# Patient Record
Sex: Female | Born: 1975 | Race: White | Hispanic: No | Smoking: Never smoker
Health system: Southern US, Community
[De-identification: ages and names within clinical notes are randomized; demographics above are authoritative.]

## PROBLEM LIST (undated history)

## (undated) DIAGNOSIS — M545 Low back pain, unspecified: Secondary | ICD-10-CM

## (undated) DIAGNOSIS — D6862 Lupus anticoagulant syndrome: Secondary | ICD-10-CM

## (undated) DIAGNOSIS — E038 Other specified hypothyroidism: Secondary | ICD-10-CM

## (undated) DIAGNOSIS — I10 Essential (primary) hypertension: Secondary | ICD-10-CM

## (undated) DIAGNOSIS — E559 Vitamin D deficiency, unspecified: Secondary | ICD-10-CM

## (undated) DIAGNOSIS — F411 Generalized anxiety disorder: Secondary | ICD-10-CM

## (undated) DIAGNOSIS — G43119 Migraine with aura, intractable, without status migrainosus: Secondary | ICD-10-CM

## (undated) DIAGNOSIS — D519 Vitamin B12 deficiency anemia, unspecified: Secondary | ICD-10-CM

## (undated) DIAGNOSIS — E782 Mixed hyperlipidemia: Secondary | ICD-10-CM

## (undated) HISTORY — DX: Low back pain, unspecified: M54.50

## (undated) HISTORY — DX: Mixed hyperlipidemia: E78.2

## (undated) HISTORY — DX: Essential (primary) hypertension: I10

## (undated) HISTORY — DX: Vitamin D deficiency, unspecified: E55.9

## (undated) HISTORY — PX: OVARIAN CYST REMOVAL: SHX89

## (undated) HISTORY — DX: Other specified hypothyroidism: E03.8

## (undated) HISTORY — PX: TUBAL LIGATION: SHX77

## (undated) HISTORY — DX: Migraine with aura, intractable, without status migrainosus: G43.119

## (undated) HISTORY — DX: Lupus anticoagulant syndrome: D68.62

## (undated) HISTORY — DX: Generalized anxiety disorder: F41.1

## (undated) HISTORY — DX: Vitamin B12 deficiency anemia, unspecified: D51.9

---
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
## 2014-06-19 DIAGNOSIS — M549 Dorsalgia, unspecified: Secondary | ICD-10-CM

## 2014-06-19 HISTORY — DX: Dorsalgia, unspecified: M54.9

## 2017-01-03 DIAGNOSIS — R202 Paresthesia of skin: Secondary | ICD-10-CM

## 2017-01-03 DIAGNOSIS — R0789 Other chest pain: Secondary | ICD-10-CM | POA: Insufficient documentation

## 2017-01-03 DIAGNOSIS — R200 Anesthesia of skin: Secondary | ICD-10-CM

## 2017-01-03 DIAGNOSIS — R0602 Shortness of breath: Secondary | ICD-10-CM

## 2017-01-03 HISTORY — DX: Paresthesia of skin: R20.0

## 2017-01-03 HISTORY — DX: Paresthesia of skin: R20.2

## 2017-01-03 HISTORY — DX: Other chest pain: R07.89

## 2017-01-03 HISTORY — DX: Shortness of breath: R06.02

## 2017-01-17 DIAGNOSIS — R899 Unspecified abnormal finding in specimens from other organs, systems and tissues: Secondary | ICD-10-CM

## 2017-01-17 HISTORY — DX: Unspecified abnormal finding in specimens from other organs, systems and tissues: R89.9

## 2017-05-23 DIAGNOSIS — IMO0002 Reserved for concepts with insufficient information to code with codable children: Secondary | ICD-10-CM

## 2017-05-23 HISTORY — DX: Reserved for concepts with insufficient information to code with codable children: IMO0002

## 2017-10-15 DIAGNOSIS — N261 Atrophy of kidney (terminal): Secondary | ICD-10-CM

## 2017-10-15 DIAGNOSIS — I10 Essential (primary) hypertension: Secondary | ICD-10-CM | POA: Insufficient documentation

## 2017-10-15 HISTORY — DX: Atrophy of kidney (terminal): N26.1

## 2019-06-20 ENCOUNTER — Ambulatory Visit (INDEPENDENT_AMBULATORY_CARE_PROVIDER_SITE_OTHER): Payer: Self-pay | Admitting: Cardiology

## 2019-06-20 ENCOUNTER — Other Ambulatory Visit: Payer: Self-pay

## 2019-06-20 ENCOUNTER — Encounter: Payer: Self-pay | Admitting: Cardiology

## 2019-06-20 VITALS — BP 148/100 | HR 90 | Ht 63.0 in | Wt 148.0 lb

## 2019-06-20 DIAGNOSIS — M545 Low back pain, unspecified: Secondary | ICD-10-CM | POA: Insufficient documentation

## 2019-06-20 DIAGNOSIS — R200 Anesthesia of skin: Secondary | ICD-10-CM

## 2019-06-20 DIAGNOSIS — R079 Chest pain, unspecified: Secondary | ICD-10-CM

## 2019-06-20 DIAGNOSIS — G43119 Migraine with aura, intractable, without status migrainosus: Secondary | ICD-10-CM | POA: Insufficient documentation

## 2019-06-20 DIAGNOSIS — F411 Generalized anxiety disorder: Secondary | ICD-10-CM | POA: Insufficient documentation

## 2019-06-20 DIAGNOSIS — E782 Mixed hyperlipidemia: Secondary | ICD-10-CM

## 2019-06-20 DIAGNOSIS — E559 Vitamin D deficiency, unspecified: Secondary | ICD-10-CM | POA: Insufficient documentation

## 2019-06-20 DIAGNOSIS — E038 Other specified hypothyroidism: Secondary | ICD-10-CM | POA: Insufficient documentation

## 2019-06-20 DIAGNOSIS — D6862 Lupus anticoagulant syndrome: Secondary | ICD-10-CM | POA: Insufficient documentation

## 2019-06-20 DIAGNOSIS — I10 Essential (primary) hypertension: Secondary | ICD-10-CM

## 2019-06-20 DIAGNOSIS — D519 Vitamin B12 deficiency anemia, unspecified: Secondary | ICD-10-CM | POA: Insufficient documentation

## 2019-06-20 DIAGNOSIS — R0602 Shortness of breath: Secondary | ICD-10-CM

## 2019-06-20 HISTORY — DX: Chest pain, unspecified: R07.9

## 2019-06-20 MED ORDER — LOSARTAN POTASSIUM 25 MG PO TABS: 25.0000 mg | ORAL_TABLET | Freq: Every day | ORAL | 1 refills | Status: DC

## 2019-06-20 NOTE — Patient Instructions (Signed)
Medication Instructions:  Your physician has recommended you make the following change in your medication:   STOP: Lisinopril   START: Losartan 25 mg daily   *If you need a refill on your cardiac medications before your next appointment, please call your pharmacy*   Lab Work: None.   If you have labs (blood work) drawn today and your tests are completely normal, you will receive your results only by: Marland Kitchen MyChart Message (if you have MyChart) OR . A paper copy in the mail If you have any lab test that is abnormal or we need to change your treatment, we will call you to review the results.   Testing/Procedures: Your physician has requested that you have an echocardiogram. Echocardiography is a painless test that uses sound waves to create images of your heart. It provides your doctor with information about the size and shape of your heart and how well your heart's chambers and valves are working. This procedure takes approximately one hour. There are no restrictions for this procedure.  Your physician has requested that you have en exercise stress myoview. For further information please visit [email or web address]. Please follow instruction sheet, as given.     Follow-Up: At [facility], you and your health needs are our priority.  As part of our continuing mission to provide you with exceptional heart care, we have created designated Provider Care Teams.  These Care Teams include your primary Cardiologist (physician) and Advanced Practice Providers (APPs -  Physician Assistants and Nurse Practitioners) who all work together to provide you with the care you need, when you need it.  We recommend signing up for the patient portal called "MyChart".  Sign up information is provided on this After Visit Summary.  MyChart is used to connect with patients for Virtual Visits (Telemedicine).  Patients are able to view lab/test results, encounter notes, upcoming appointments, etc.  Non-urgent  messages can be sent to your provider as well.   To learn more about what you can do with MyChart, go to [email or web address].    Your next appointment:   1 month(s)  The format for your next appointment:   In Person  Provider:   Berniece Salines, DO   Other Instructions   Echocardiogram An echocardiogram is a procedure that uses painless sound waves (ultrasound) to produce an image of the heart. Images from an echocardiogram can provide important information about:  Signs of coronary artery disease (CAD).  Aneurysm detection. An aneurysm is a weak or damaged part of an artery wall that bulges out from the normal force of blood pumping through the body.  Heart size and shape. Changes in the size or shape of the heart can be associated with certain conditions, including heart failure, aneurysm, and CAD.  Heart muscle function.  Heart valve function.  Signs of a past heart attack.  Fluid buildup around the heart.  Thickening of the heart muscle.  A tumor or infectious growth around the heart valves. Tell a health care provider about:  Any allergies you have.  All medicines you are taking, including vitamins, herbs, eye drops, creams, and over-the-counter medicines.  Any blood disorders you have.  Any surgeries you have had.  Any medical conditions you have.  Whether you are pregnant or may be pregnant. What are the risks? Generally, this is a safe procedure. However, problems may occur, including:  Allergic reaction to dye (contrast) that may be used during the procedure. What happens before the procedure? No specific preparation is needed. You  may eat and drink normally. What happens during the procedure?   An IV tube may be inserted into one of your veins.  You may receive contrast through this tube. A contrast is an injection that improves the quality of the pictures from your heart.  A gel will be applied to your chest.  A wand-like tool (transducer)  will be moved over your chest. The gel will help to transmit the sound waves from the transducer.  The sound waves will harmlessly bounce off of your heart to allow the heart images to be captured in real-time motion. The images will be recorded on a computer. The procedure may vary among health care providers and hospitals. What happens after the procedure?  You may return to your normal, everyday life, including diet, activities, and medicines, unless your health care provider tells you not to do that. Summary  An echocardiogram is a procedure that uses painless sound waves (ultrasound) to produce an image of the heart.  Images from an echocardiogram can provide important information about the size and shape of your heart, heart muscle function, heart valve function, and fluid buildup around your heart.  You do not need to do anything to prepare before this procedure. You may eat and drink normally.  After the echocardiogram is completed, you may return to your normal, everyday life, unless your health care provider tells you not to do that.  Losartan Tablets What is this medicine? LOSARTAN (loe SAR tan) is an angiotensin II receptor blocker, also known as an ARB. It treats high blood pressure. It can slow kidney damage in some patients. It may also be used to lower the risk of stroke. This medicine may be used for other purposes; ask your health care provider or pharmacist if you have questions. COMMON BRAND NAME(S): Cozaar What should I tell my health care provider before I take this medicine? They need to know if you have any of these conditions:  heart failure  kidney or liver disease  an unusual or allergic reaction to losartan, other medicines, foods, dyes, or preservatives  pregnant or trying to get pregnant  breast-feeding How should I use this medicine? Take this drug by mouth. Take it as directed on the prescription label at the same time every day. You can take it with  or without food. If it upsets your stomach, take it with food. Keep taking it unless your health care provider tells you to stop. Talk to your health care provider about the use of this drug in children. While it may be prescribed for children as young as 6 for selected conditions, precautions do apply. Overdosage: If you think you have taken too much of this medicine contact a poison control center or emergency room at once. NOTE: This medicine is only for you. Do not share this medicine with others. What if I miss a dose? If you miss a dose, take it as soon as you can. If it is almost time for your next dose, take only that dose. Do not take double or extra doses. What may interact with this medicine?  blood pressure medicines  diuretics, especially triamterene, spironolactone, or amiloride  fluconazole  NSAIDs, medicines for pain and inflammation, like ibuprofen or naproxen  potassium salts or potassium supplements  rifampin This list may not describe all possible interactions. Give your health care provider a list of all the medicines, herbs, non-prescription drugs, or dietary supplements you use. Also tell them if you smoke, drink alcohol, or  use illegal drugs. Some items may interact with your medicine. What should I watch for while using this medicine? Visit your doctor or health care professional for regular checks on your progress. Check your blood pressure as directed. Ask your doctor or health care professional what your blood pressure should be and when you should contact him or her. Call your doctor or health care professional if you notice an irregular or fast heart beat. Women should inform their doctor if they wish to become pregnant or think they might be pregnant. There is a potential for serious side effects to an unborn child, particularly in the second or third trimester. Talk to your health care professional or pharmacist for more information. You may get drowsy or dizzy.  Do not drive, use machinery, or do anything that needs mental alertness until you know how this drug affects you. Do not stand or sit up quickly, especially if you are an older patient. This reduces the risk of dizzy or fainting spells. Alcohol can make you more drowsy and dizzy. Avoid alcoholic drinks. Avoid salt substitutes unless you are told otherwise by your doctor or health care professional. Do not treat yourself for coughs, colds, or pain while you are taking this medicine without asking your doctor or health care professional for advice. Some ingredients may increase your blood pressure. What side effects may I notice from receiving this medicine? Side effects that you should report to your doctor or health care professional as soon as possible:  confusion, dizziness, light headedness or fainting spells  decreased amount of urine passed  difficulty breathing or swallowing, hoarseness, or tightening of the throat  fast or irregular heart beat, palpitations, or chest pain  skin rash, itching  swelling of your face, lips, tongue, hands, or feet Side effects that usually do not require medical attention (report to your doctor or health care professional if they continue or are bothersome):  cough  decreased sexual function or desire  headache  nasal congestion or stuffiness  nausea or stomach pain  sore or cramping muscles This list may not describe all possible side effects. Call your doctor for medical advice about side effects. You may report side effects to FDA at 1-800-FDA-1088. Where should I keep my medicine? Keep out of the reach of children and pets. Store at room temperature between 15 and 30 degrees C (59 and 86 degrees F). Protect from light. Keep the container tightly closed. Throw away any unused drug after the expiration date. NOTE: This sheet is a summary. It may not cover all possible information. If you have questions about this medicine, talk to your doctor,  pharmacist, or health care provider.  2020 Elsevier/Gold Standard (2018-10-23 12:12:28)  This information is not intended to replace advice given to you by your health care provider. Make sure you discuss any questions you have with your health care provider. Document Revised: 07/11/2018 Document Reviewed: 04/22/2016 Elsevier Patient Education  2020 [employer]   Cardiac Nuclear Scan A cardiac nuclear scan is a test that measures blood flow to the heart when a person is resting and when he or she is exercising. The test looks for problems such as:  Not enough blood reaching a portion of the heart.  The heart muscle not working normally. You may need this test if:  You have heart disease.  You have had abnormal lab results.  You have had heart surgery or a balloon procedure to open up blocked arteries (angioplasty).  You have chest  pain.  You have shortness of breath. In this test, a radioactive dye (tracer) is injected into your bloodstream. After the tracer has traveled to your heart, an imaging device is used to measure how much of the tracer is absorbed by or distributed to various areas of your heart. This procedure is usually done at a hospital and takes 2-4 hours. Tell a health care provider about:  Any allergies you have.  All medicines you are taking, including vitamins, herbs, eye drops, creams, and over-the-counter medicines.  Any problems you or family members have had with anesthetic medicines.  Any blood disorders you have.  Any surgeries you have had.  Any medical conditions you have.  Whether you are pregnant or may be pregnant. What are the risks? Generally, this is a safe procedure. However, problems may occur, including:  Serious chest pain and heart attack. This is only a risk if the stress portion of the test is done.  Rapid heartbeat.  Sensation of warmth in your chest. This usually passes quickly.  Allergic reaction to the tracer. What  happens before the procedure?  Ask your health care provider about changing or stopping your regular medicines. This is especially important if you are taking diabetes medicines or blood thinners.  Follow instructions from your health care provider about eating or drinking restrictions.  Remove your jewelry on the day of the procedure. What happens during the procedure?  An IV will be inserted into one of your veins.  Your health care provider will inject a small amount of radioactive tracer through the IV.  You will wait for 20-40 minutes while the tracer travels through your bloodstream.  Your heart activity will be monitored with an electrocardiogram (ECG).  You will lie down on an exam table.  Images of your heart will be taken for about 15-20 minutes.  You may also have a stress test. For this test, one of the following may be done: ? You will exercise on a treadmill or stationary bike. While you exercise, your heart's activity will be monitored with an ECG, and your blood pressure will be checked. ? You will be given medicines that will increase blood flow to parts of your heart. This is done if you are unable to exercise.  When blood flow to your heart has peaked, a tracer will again be injected through the IV.  After 20-40 minutes, you will get back on the exam table and have more images taken of your heart.  Depending on the type of tracer used, scans may need to be repeated 3-4 hours later.  Your IV line will be removed when the procedure is over. The procedure may vary among health care providers and hospitals. What happens after the procedure?  Unless your health care provider tells you otherwise, you may return to your normal schedule, including diet, activities, and medicines.  Unless your health care provider tells you otherwise, you may increase your fluid intake. This will help to flush the contrast dye from your body. Drink enough fluid to keep your urine pale  yellow.  Ask your health care provider, or the department that is doing the test: ? When will my results be ready? ? How will I get my results? Summary  A cardiac nuclear scan measures the blood flow to the heart when a person is resting and when he or she is exercising.  Tell your health care provider if you are pregnant.  Before the procedure, ask your health care provider about  changing or stopping your regular medicines. This is especially important if you are taking diabetes medicines or blood thinners.  After the procedure, unless your health care provider tells you otherwise, increase your fluid intake. This will help flush the contrast dye from your body.  After the procedure, unless your health care provider tells you otherwise, you may return to your normal schedule, including diet, activities, and medicines. This information is not intended to replace advice given to you by your health care provider. Make sure you discuss any questions you have with your health care provider. Document Revised: 09/03/2017 Document Reviewed: 09/03/2017 Elsevier Patient Education  2020 [employer].

## 2019-06-20 NOTE — Progress Notes (Signed)
Cardiology Office Note:    Date:  06/20/2019   ID:  Donna Sutton, DOB 08-23-1975, MRN [id number]  PCP:  Bonnita Nasuti, MD  Cardiologist:  No primary care provider on file.  Electrophysiologist:  None   Referring MD: Bonnita Nasuti, MD   Chief Complaint  Patient presents with  . Chest Pain  . Shortness of Breath    History of Present Illness:    Donna Sutton is a 44 y.o. female with a hx of hypertension, hyperlipidemia, family history of premature coronary artery disease who presents today to be evaluated for chest.  Patient tells me for 3 weeks she has been experiencing left-sided sharp shooting pain.  He denies any radiation but tells me it is associated with shortness of breath.  At times she tells me that she feels dizzy.  Recently, the patient visited [facility] due to facial numbness and tingling she had a CT scan which was reported to be normal.  She was asked to stay to get an MRI and has been concerned about stroke but the patient left [place].   She tells me that she has not been taking lisinopril as this medications makes her significant tired no other complaints at this time..  Past Medical History:  Diagnosis Date  . B12 deficiency anemia   . Essential hypertension   . Generalized anxiety disorder   . Low back pain   . 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 Migraine with aura, intractable, without status migrainosus   . Mixed hyperlipidemia   . Other specified hypothyroidism   . Vitamin D deficiency     Past Surgical History:  Procedure Laterality Date  . CESAREAN SECTION     X4  . OVARIAN CYST REMOVAL    . TUBAL LIGATION      Current Medications: Current Meds  Medication Sig  . Cholecalciferol 125 MCG (5000 UT) TABS Take by mouth.  . cyanocobalamin 1000 MCG tablet Take by mouth.  . ergocalciferol (VITAMIN D2) 1.25 MG (50000 UT) capsule Take by mouth.  . hydroxychloroquine (PLAQUENIL) 200 MG tablet Take 200 mg by mouth daily.    . Omega-3 Fatty Acids (FISH OIL) 1000 MG CAPS Take 1,000 mg by mouth daily.  Marland Kitchen topiramate (TOPAMAX) 50 MG tablet Take 50 mg by mouth daily.  . [DISCONTINUED] lisinopril (ZESTRIL) 5 MG tablet Take 5 mg by mouth daily. Take only if BP is greater than 140/90     Allergies:   Morphine, Other, and Piperacillin-tazobactam in dex   Social History   Socioeconomic History  . Marital status: Unknown    Spouse name: Not on file  . Number of children: Not on file  . Years of education: Not on file  . Highest education level: Not on file  Occupational History  . Not on file  Tobacco Use  . Smoking status: Never Smoker  . Smokeless tobacco: Never Used  Substance and Sexual Activity  . Alcohol use: Never  . Drug use: Never  . Sexual activity: Not on file  Other Topics Concern  . Not on file  Social History Narrative  . Not on file   Social Determinants of Health   Financial Resource Strain:   . Difficulty of Paying Living Expenses:   Food Insecurity:   . Worried About Charity fundraiser in the Last Year:   . Arboriculturist in the Last Year:   Transportation Needs:   . Film/video editor (Medical):   Marland Kitchen  Lack of Transportation (Non-Medical):   Physical Activity:   . Days of Exercise per Week:   . Minutes of Exercise per Session:   Stress:   . Feeling of Stress :   Social Connections:   . Frequency of Communication with Friends and Family:   . Frequency of Social Gatherings with Friends and Family:   . Attends Religious Services:   . Active Member of Clubs or Organizations:   . Attends Archivist Meetings:   Marland Kitchen Marital Status:      Family History: The patient's family history includes Anxiety disorder in her mother; COPD in her mother; Kidney failure in her father.  ROS:   Review of Systems  Constitution: Negative for decreased appetite, fever and weight gain.  HENT: Negative for congestion, ear discharge, hoarse voice and sore throat.   Eyes: Negative for  discharge, redness, vision loss in right eye and visual halos.  Cardiovascular: Reports chest pain, dyspnea on exertion.  Negative for, leg swelling, orthopnea and palpitations.  Respiratory: Negative for cough, hemoptysis, shortness of breath and snoring.   Endocrine: Negative for heat intolerance and polyphagia.  Hematologic/Lymphatic: Negative for bleeding problem. Does not bruise/bleed easily.  Skin: Negative for flushing, nail changes, rash and suspicious lesions.  Musculoskeletal: Negative for arthritis, joint pain, muscle cramps, myalgias, neck pain and stiffness.  Gastrointestinal: Negative for abdominal pain, bowel incontinence, diarrhea and excessive appetite.  Genitourinary: Negative for decreased libido, genital sores and incomplete emptying.  Neurological: Reports facial numbness and tingling negative for brief paralysis, focal weakness, headaches and loss of balance.  Psychiatric/Behavioral: Negative for altered mental status, depression and suicidal ideas.  Allergic/Immunologic: Negative for HIV exposure and persistent infections.    EKGs/Labs/Other Studies Reviewed:    The following studies were reviewed today:   EKG:  The ekg ordered today demonstrates sinus rhythm, heart rate 85 bpm  CT scan [facility] FINDINGS: 2 days ago # No acute intracranial hemorrhage. # No masses, mass effect, midline shift or hydrocephalus. # White matter is intact. # Calvarium is intact. # Visualized orbits and globes are unremarkable without radiopaque foreign bodies. # Visualized paranasal sinuses are clear. # Visualized mastoid air cells are clear.  Recent Labs: No results found for requested labs within last 8760 hours.  Chemistry done in 06/18/19 Magnesium 2.1, sodium 137, potassium 3.8, chloride 107, bicarb 24, glucose 76, BUN 13, creatinine 1.0, calcium 8.9, alk phos 111, 4.3, total protein 7.9, albumin 4.6, globin 10.3, ALT 14, AST 14 CBC: WBC 9.0, hemoglobin 13.4,  hematocrit 42.4, platelets 410  Troponin <0.01 Recent Lipid Panel No results found for: CHOL, TRIG, HDL, CHOLHDL, VLDL, LDLCALC, LDLDIRECT  Physical Exam:    VS:  BP (!) 148/100 (BP Location: Left Arm, Patient Position: Sitting, Cuff Size: Normal)   Pulse 90   Ht '5\' 3"'$  (1.6 m)   Wt 148 lb (67.1 kg)   SpO2 98%   BMI 26.22 kg/m     Wt Readings from Last 3 Encounters:  06/20/19 148 lb (67.1 kg)     GEN: Well nourished, well developed in no acute distress HEENT: Normal NECK: No JVD; No carotid bruits LYMPHATICS: No lymphadenopathy CARDIAC: S1S2 noted,RRR, no murmurs, rubs, gallops RESPIRATORY:  Clear to auscultation without rales, wheezing or rhonchi  ABDOMEN: Soft, non-tender, non-distended, +bowel sounds, no guarding. EXTREMITIES: No edema, No cyanosis, no clubbing MUSCULOSKELETAL:  No deformity  SKIN: Warm and dry NEUROLOGIC:  Alert and oriented x 3, non-focal PSYCHIATRIC:  Normal affect, good insight  ASSESSMENT:  1. Chest pain of uncertain etiology   2. Shortness of breath   3. Mixed hyperlipidemia   4. Essential hypertension   5. Facial numbness    PLAN:     Her chest pain is concerning given her risk factors of hypertension and family history significant for  premature coronary artery disease.  An exercise nuclear stress test will be at this time.  Educated patient about this testing.  She is agreeable to proceed with this testing.  In terms of her shortness of breath, we will get a transthoracic echocardiogram to assess RV/LV function, any valvular abnormalities or any other structural abnormalities.  She is hypertensive in the office today she stopped lisinopril due to medication making her fatigue I am going to start losartan 25 mg daily I have educated patient about this medication she is agreeable to proceed.  In terms of her facial numbness , I advised the patient that she go back to the ED to be evaluated and hopefully get an MRI.  She tells me that she  at this time does have a lot of family problems and she is not willing to go back to the ED she will see her family physician soon and neurology.    The patient is in agreement with the above plan. The patient left the office in stable condition.  The patient will follow up in 1 month for medication management and blood pressure.  Medication Adjustments/Labs and Tests Ordered: Current medicines are reviewed at length with the patient today.  Concerns regarding medicines are outlined above.  Orders Placed This Encounter  Procedures  . MYOCARDIAL PERFUSION IMAGING  . EKG 12-Lead  . ECHOCARDIOGRAM COMPLETE   Meds ordered this encounter  Medications  . losartan (COZAAR) 25 MG tablet    Sig: Take 1 tablet (25 mg total) by mouth daily.    Dispense:  90 tablet    Refill:  1    Patient Instructions  Medication Instructions:  Your physician has recommended you make the following change in your medication:   STOP: Lisinopril   START: Losartan 25 mg daily   *If you need a refill on your cardiac medications before your next appointment, please call your pharmacy*   Lab Work: None.   If you have labs (blood work) drawn today and your tests are completely normal, you will receive your results only by: Marland Kitchen MyChart Message (if you have MyChart) OR . A paper copy in the mail If you have any lab test that is abnormal or we need to change your treatment, we will call you to review the results.   Testing/Procedures: Your physician has requested that you have an echocardiogram. Echocardiography is a painless test that uses sound waves to create images of your heart. It provides your doctor with information about the size and shape of your heart and how well your heart's chambers and valves are working. This procedure takes approximately one hour. There are no restrictions for this procedure.  Your physician has requested that you have en exercise stress myoview. For further information please  visit [email or web address]. Please follow instruction sheet, as given.     Follow-Up: At [facility], you and your health needs are our priority.  As part of our continuing mission to provide you with exceptional heart care, we have created designated Provider Care Teams.  These Care Teams include your primary Cardiologist (physician) and Advanced Practice Providers (APPs -  Physician Assistants and Nurse Practitioners) who all work together to provide  you with the care you need, when you need it.  We recommend signing up for the patient portal called "MyChart".  Sign up information is provided on this After Visit Summary.  MyChart is used to connect with patients for Virtual Visits (Telemedicine).  Patients are able to view lab/test results, encounter notes, upcoming appointments, etc.  Non-urgent messages can be sent to your provider as well.   To learn more about what you can do with MyChart, go to [email or web address].    Your next appointment:   1 month(s)  The format for your next appointment:   In Person  Provider:   Berniece Salines, DO   Other Instructions   Echocardiogram An echocardiogram is a procedure that uses painless sound waves (ultrasound) to produce an image of the heart. Images from an echocardiogram can provide important information about:  Signs of coronary artery disease (CAD).  Aneurysm detection. An aneurysm is a weak or damaged part of an artery wall that bulges out from the normal force of blood pumping through the body.  Heart size and shape. Changes in the size or shape of the heart can be associated with certain conditions, including heart failure, aneurysm, and CAD.  Heart muscle function.  Heart valve function.  Signs of a past heart attack.  Fluid buildup around the heart.  Thickening of the heart muscle.  A tumor or infectious growth around the heart valves. Tell a health care provider about:  Any allergies you have.  All medicines you  are taking, including vitamins, herbs, eye drops, creams, and over-the-counter medicines.  Any blood disorders you have.  Any surgeries you have had.  Any medical conditions you have.  Whether you are pregnant or may be pregnant. What are the risks? Generally, this is a safe procedure. However, problems may occur, including:  Allergic reaction to dye (contrast) that may be used during the procedure. What happens before the procedure? No specific preparation is needed. You may eat and drink normally. What happens during the procedure?   An IV tube may be inserted into one of your veins.  You may receive contrast through this tube. A contrast is an injection that improves the quality of the pictures from your heart.  A gel will be applied to your chest.  A wand-like tool (transducer) will be moved over your chest. The gel will help to transmit the sound waves from the transducer.  The sound waves will harmlessly bounce off of your heart to allow the heart images to be captured in real-time motion. The images will be recorded on a computer. The procedure may vary among health care providers and hospitals. What happens after the procedure?  You may return to your normal, everyday life, including diet, activities, and medicines, unless your health care provider tells you not to do that. Summary  An echocardiogram is a procedure that uses painless sound waves (ultrasound) to produce an image of the heart.  Images from an echocardiogram can provide important information about the size and shape of your heart, heart muscle function, heart valve function, and fluid buildup around your heart.  You do not need to do anything to prepare before this procedure. You may eat and drink normally.  After the echocardiogram is completed, you may return to your normal, everyday life, unless your health care provider tells you not to do that.  Losartan Tablets What is this medicine? LOSARTAN  (loe SAR tan) is an angiotensin II receptor blocker, also known as an ARB.  It treats high blood pressure. It can slow kidney damage in some patients. It may also be used to lower the risk of stroke. This medicine may be used for other purposes; ask your health care provider or pharmacist if you have questions. COMMON BRAND NAME(S): Cozaar What should I tell my health care provider before I take this medicine? They need to know if you have any of these conditions:  heart failure  kidney or liver disease  an unusual or allergic reaction to losartan, other medicines, foods, dyes, or preservatives  pregnant or trying to get pregnant  breast-feeding How should I use this medicine? Take this drug by mouth. Take it as directed on the prescription label at the same time every day. You can take it with or without food. If it upsets your stomach, take it with food. Keep taking it unless your health care provider tells you to stop. Talk to your health care provider about the use of this drug in children. While it may be prescribed for children as young as 6 for selected conditions, precautions do apply. Overdosage: If you think you have taken too much of this medicine contact a poison control center or emergency room at once. NOTE: This medicine is only for you. Do not share this medicine with others. What if I miss a dose? If you miss a dose, take it as soon as you can. If it is almost time for your next dose, take only that dose. Do not take double or extra doses. What may interact with this medicine?  blood pressure medicines  diuretics, especially triamterene, spironolactone, or amiloride  fluconazole  NSAIDs, medicines for pain and inflammation, like ibuprofen or naproxen  potassium salts or potassium supplements  rifampin This list may not describe all possible interactions. Give your health care provider a list of all the medicines, herbs, non-prescription drugs, or dietary supplements  you use. Also tell them if you smoke, drink alcohol, or use illegal drugs. Some items may interact with your medicine. What should I watch for while using this medicine? Visit your doctor or health care professional for regular checks on your progress. Check your blood pressure as directed. Ask your doctor or health care professional what your blood pressure should be and when you should contact him or her. Call your doctor or health care professional if you notice an irregular or fast heart beat. Women should inform their doctor if they wish to become pregnant or think they might be pregnant. There is a potential for serious side effects to an unborn child, particularly in the second or third trimester. Talk to your health care professional or pharmacist for more information. You may get drowsy or dizzy. Do not drive, use machinery, or do anything that needs mental alertness until you know how this drug affects you. Do not stand or sit up quickly, especially if you are an older patient. This reduces the risk of dizzy or fainting spells. Alcohol can make you more drowsy and dizzy. Avoid alcoholic drinks. Avoid salt substitutes unless you are told otherwise by your doctor or health care professional. Do not treat yourself for coughs, colds, or pain while you are taking this medicine without asking your doctor or health care professional for advice. Some ingredients may increase your blood pressure. What side effects may I notice from receiving this medicine? Side effects that you should report to your doctor or health care professional as soon as possible:  confusion, dizziness, light headedness or fainting spells  decreased amount of urine passed  difficulty breathing or swallowing, hoarseness, or tightening of the throat  fast or irregular heart beat, palpitations, or chest pain  skin rash, itching  swelling of your face, lips, tongue, hands, or feet Side effects that usually do not require  medical attention (report to your doctor or health care professional if they continue or are bothersome):  cough  decreased sexual function or desire  headache  nasal congestion or stuffiness  nausea or stomach pain  sore or cramping muscles This list may not describe all possible side effects. Call your doctor for medical advice about side effects. You may report side effects to FDA at 1-800-FDA-1088. Where should I keep my medicine? Keep out of the reach of children and pets. Store at room temperature between 15 and 30 degrees C (59 and 86 degrees F). Protect from light. Keep the container tightly closed. Throw away any unused drug after the expiration date. NOTE: This sheet is a summary. It may not cover all possible information. If you have questions about this medicine, talk to your doctor, pharmacist, or health care provider.  2020 Elsevier/Gold Standard (2018-10-23 12:12:28)  This information is not intended to replace advice given to you by your health care provider. Make sure you discuss any questions you have with your health care provider. Document Revised: 07/11/2018 Document Reviewed: 04/22/2016 Elsevier Patient Education  2020 [place].   Cardiac Nuclear Scan A cardiac nuclear scan is a test that measures blood flow to the heart when a person is resting and when he or she is exercising. The test looks for problems such as:  Not enough blood reaching a portion of the heart.  The heart muscle not working normally. You may need this test if:  You have heart disease.  You have had abnormal lab results.  You have had heart surgery or a balloon procedure to open up blocked arteries (angioplasty).  You have chest pain.  You have shortness of breath. In this test, a radioactive dye (tracer) is injected into your bloodstream. After the tracer has traveled to your heart, an imaging device is used to measure how much of the tracer is absorbed by or distributed to  various areas of your heart. This procedure is usually done at a hospital and takes 2-4 hours. Tell a health care provider about:  Any allergies you have.  All medicines you are taking, including vitamins, herbs, eye drops, creams, and over-the-counter medicines.  Any problems you or family members have had with anesthetic medicines.  Any blood disorders you have.  Any surgeries you have had.  Any medical conditions you have.  Whether you are pregnant or may be pregnant. What are the risks? Generally, this is a safe procedure. However, problems may occur, including:  Serious chest pain and heart attack. This is only a risk if the stress portion of the test is done.  Rapid heartbeat.  Sensation of warmth in your chest. This usually passes quickly.  Allergic reaction to the tracer. What happens before the procedure?  Ask your health care provider about changing or stopping your regular medicines. This is especially important if you are taking diabetes medicines or blood thinners.  Follow instructions from your health care provider about eating or drinking restrictions.  Remove your jewelry on the day of the procedure. What happens during the procedure?  An IV will be inserted into one of your veins.  Your health care provider will inject a small amount of radioactive  tracer through the IV.  You will wait for 20-40 minutes while the tracer travels through your bloodstream.  Your heart activity will be monitored with an electrocardiogram (ECG).  You will lie down on an exam table.  Images of your heart will be taken for about 15-20 minutes.  You may also have a stress test. For this test, one of the following may be done: ? You will exercise on a treadmill or stationary bike. While you exercise, your heart's activity will be monitored with an ECG, and your blood pressure will be checked. ? You will be given medicines that will increase blood flow to parts of your heart.  This is done if you are unable to exercise.  When blood flow to your heart has peaked, a tracer will again be injected through the IV.  After 20-40 minutes, you will get back on the exam table and have more images taken of your heart.  Depending on the type of tracer used, scans may need to be repeated 3-4 hours later.  Your IV line will be removed when the procedure is over. The procedure may vary among health care providers and hospitals. What happens after the procedure?  Unless your health care provider tells you otherwise, you may return to your normal schedule, including diet, activities, and medicines.  Unless your health care provider tells you otherwise, you may increase your fluid intake. This will help to flush the contrast dye from your body. Drink enough fluid to keep your urine pale yellow.  Ask your health care provider, or the department that is doing the test: ? When will my results be ready? ? How will I get my results? Summary  A cardiac nuclear scan measures the blood flow to the heart when a person is resting and when he or she is exercising.  Tell your health care provider if you are pregnant.  Before the procedure, ask your health care provider about changing or stopping your regular medicines. This is especially important if you are taking diabetes medicines or blood thinners.  After the procedure, unless your health care provider tells you otherwise, increase your fluid intake. This will help flush the contrast dye from your body.  After the procedure, unless your health care provider tells you otherwise, you may return to your normal schedule, including diet, activities, and medicines. This information is not intended to replace advice given to you by your health care provider. Make sure you discuss any questions you have with your health care provider. Document Revised: 09/03/2017 Document Reviewed: 09/03/2017 Elsevier Patient Education  [place].     Adopting a Healthy Lifestyle.  Know what a healthy weight is for you (roughly BMI <25) and aim to maintain this   Aim for 7+ servings of fruits and vegetables daily   65-80+ fluid ounces of water or unsweet tea for healthy kidneys   Limit to max 1 drink of alcohol per day; avoid smoking/tobacco   Limit animal fats in diet for cholesterol and heart health - choose grass fed whenever available   Avoid highly processed foods, and foods high in saturated/trans fats   Aim for low stress - take time to unwind and care for your mental health   Aim for 150 min of moderate intensity exercise weekly for heart health, and weights twice weekly for bone health   Aim for 7-9 hours of sleep daily   When it comes to diets, agreement about the perfect plan isnt easy to  find, even among the experts. Experts at the [employer] developed an idea known as the Healthy Eating Plate. Just imagine a plate divided into logical, healthy portions.   The emphasis is on diet quality:   Load up on vegetables and fruits - one-half of your plate: Aim for color and variety, and remember that potatoes dont count.   Go for whole grains - one-quarter of your plate: Whole wheat, barley, wheat berries, quinoa, oats, brown rice, and foods made with them. If you want pasta, go with whole wheat pasta.   Protein power - one-quarter of your plate: Fish, chicken, beans, and nuts are all healthy, versatile protein sources. Limit red meat.   The diet, however, does go beyond the plate, offering a few other suggestions.   Use healthy plant oils, such as olive, canola, soy, corn, sunflower and peanut. Check the labels, and avoid partially hydrogenated oil, which have unhealthy trans fats.   If youre thirsty, drink water. Coffee and tea are good in moderation, but skip sugary drinks and limit milk and dairy products to one or two daily servings.   The type of carbohydrate in the diet is more  important than the amount. Some sources of carbohydrates, such as vegetables, fruits, whole grains, and beans-are healthier than others.   Finally, stay active  Signed, Berniece Salines, DO  06/20/2019 5:43 PM    [facility]

## 2019-06-24 ENCOUNTER — Ambulatory Visit (HOSPITAL_BASED_OUTPATIENT_CLINIC_OR_DEPARTMENT_OTHER): Admission: RE | Admit: 2019-06-24 | Payer: Self-pay | Source: Ambulatory Visit

## 2019-06-26 ENCOUNTER — Telehealth (HOSPITAL_COMMUNITY): Payer: Self-pay

## 2019-06-26 NOTE — Telephone Encounter (Signed)
Attempted to contact the patient to leave detailed instructions for her test, but her mailbox is full. S.Emerick Weatherly EMTP

## 2019-06-30 ENCOUNTER — Inpatient Hospital Stay (HOSPITAL_COMMUNITY): Admission: RE | Admit: 2019-06-30 | Payer: Self-pay | Source: Ambulatory Visit

## 2019-07-03 ENCOUNTER — Encounter (HOSPITAL_COMMUNITY): Payer: Self-pay

## 2019-07-03 ENCOUNTER — Other Ambulatory Visit (HOSPITAL_COMMUNITY): Payer: Self-pay

## 2019-07-11 ENCOUNTER — Telehealth (HOSPITAL_COMMUNITY): Payer: Self-pay | Admitting: Cardiology

## 2019-07-11 NOTE — Telephone Encounter (Signed)
Called patient to schedule her Exercise Myoview that she no showed and patient states she is not able to reschedule due to she cant be out of work for [employer] due to finances. She sees Dr. Servando Salina on 07/17/19 and will discuss then, order will be removed from [facility]Q.

## 2019-07-21 ENCOUNTER — Ambulatory Visit: Payer: Self-pay | Admitting: Cardiology

## 2019-08-01 ENCOUNTER — Telehealth: Payer: Self-pay | Admitting: Cardiology

## 2019-08-01 DIAGNOSIS — I10 Essential (primary) hypertension: Secondary | ICD-10-CM

## 2019-08-01 DIAGNOSIS — R079 Chest pain, unspecified: Secondary | ICD-10-CM

## 2019-08-01 NOTE — Telephone Encounter (Signed)
New Message  Pt called and needed to reschedule her Myocardial Perfusion, Echocardiogram, and appt with Dr. Servando Salina. Tried to reschedule the latter two but the orders were cancelled due to being a no show. Pt wants to request orders to be put back in for both since she is currently out of work due to health reasons.  Please call back to discuss.

## 2019-08-01 NOTE — Telephone Encounter (Signed)
Called and spoke to the patient. She states that she had to cancel her echo, stress test, and appointment with Dr. Servando Salina in the past because she was working. She states that she is not working anymore and would like to reschedule them now. Since the patient had cancelled and no-showed a few times her orders were removed from the workqueue. I have re-ordered these tests. Reviewed instructions again and reminded the patient that she will need a covid test done prior to her stress test. She verbalized understanding. Made her aware that once her test were scheduled we can re-schedule her appointment with Dr. Servando Salina after her tests.   Patient states that her Sx are about the same as they were when she saw Dr. Servando Salina. She states that she has been taking the losartan 25 mg QD that was prescribed. She states that her BP has been 140/80 to 180/95 with taking the medicine. Made patient aware that I will forward to Dr. Servando Salina for review and recommendation.

## 2019-08-01 NOTE — Telephone Encounter (Signed)
Please have her see me in the clinic in the next 2 weeks.

## 2019-08-01 NOTE — Telephone Encounter (Signed)
Attempted to contact the patient but there was no answer and the VM was full.

## 2019-08-04 ENCOUNTER — Encounter (HOSPITAL_COMMUNITY): Payer: Self-pay | Admitting: Cardiology

## 2019-08-05 ENCOUNTER — Encounter: Payer: Self-pay | Admitting: Cardiology

## 2019-08-05 NOTE — Telephone Encounter (Signed)
She can get her blood work done 1 week prior to her next visit.  Please let her know

## 2019-08-05 NOTE — Telephone Encounter (Signed)
Follow up    Pt is calling back, she says she has been having problems with her phone and didn't know she had gotten a call back.   Pt also says Dr Servando Salina was wanting her to have blood work but there are no orders    Please call

## 2019-08-05 NOTE — Addendum Note (Signed)
Addended by: Delorse Limber I on: 08/05/2019 11:24 AM   Modules accepted: Orders

## 2019-08-05 NOTE — Telephone Encounter (Signed)
error 

## 2019-08-05 NOTE — Telephone Encounter (Signed)
Spoke to the patient and let her know that Dr. Servando Salina wanted her to have a BMP and a Mag drawn one week prior to her follow up visit on 08/25/19. She verbalizes understanding and states that she will get this done. No other issues or concerns were noted.    Encouraged patient to call back with any questions or concerns.

## 2019-08-18 ENCOUNTER — Other Ambulatory Visit (HOSPITAL_COMMUNITY)
Admission: RE | Admit: 2019-08-18 | Discharge: 2019-08-18 | Disposition: A | Discharge status: Home / Self Care | Payer: HRSA Program | Source: Ambulatory Visit | Attending: Cardiology | Admitting: Cardiology

## 2019-08-18 DIAGNOSIS — Z20822 Contact with and (suspected) exposure to covid-19: Secondary | ICD-10-CM | POA: Insufficient documentation

## 2019-08-18 DIAGNOSIS — Z01812 Encounter for preprocedural laboratory examination: Secondary | ICD-10-CM | POA: Diagnosis present

## 2019-08-19 ENCOUNTER — Telehealth: Payer: Self-pay

## 2019-08-19 ENCOUNTER — Telehealth (HOSPITAL_COMMUNITY): Payer: Self-pay

## 2019-08-19 LAB — SARS CORONAVIRUS 2 (TAT 6-24 HRS): SARS Coronavirus 2: NEGATIVE

## 2019-08-19 NOTE — Telephone Encounter (Signed)
-----   Message from Alver Sorrow, NP sent at 08/19/2019  7:42 AM EDT ----- Negative covid swab. Good result.

## 2019-08-19 NOTE — Telephone Encounter (Signed)
Spoke with patient regarding results.  Patient verbalizes understanding and is agreeable to plan of care. Advised patient to call back with any issues or concerns.  

## 2019-08-19 NOTE — Telephone Encounter (Signed)
Tried calling patient. No answer and voicemail is full so I can not leave any messages at this time.  

## 2019-08-19 NOTE — Telephone Encounter (Signed)
Attempted to contact the patient, to leave instructions for her test. Her answering machine is full, and no way to leave a message. Will try again later. S.Marlenne Ridge EMTP

## 2019-08-21 ENCOUNTER — Ambulatory Visit (HOSPITAL_BASED_OUTPATIENT_CLINIC_OR_DEPARTMENT_OTHER): Payer: Self-pay

## 2019-08-21 ENCOUNTER — Telehealth: Payer: Self-pay

## 2019-08-21 ENCOUNTER — Other Ambulatory Visit: Payer: Self-pay

## 2019-08-21 ENCOUNTER — Ambulatory Visit (HOSPITAL_COMMUNITY): Payer: Self-pay | Attending: Cardiovascular Disease

## 2019-08-21 DIAGNOSIS — I10 Essential (primary) hypertension: Secondary | ICD-10-CM

## 2019-08-21 DIAGNOSIS — R079 Chest pain, unspecified: Secondary | ICD-10-CM

## 2019-08-21 LAB — BASIC METABOLIC PANEL
BUN/Creatinine Ratio: 10 (ref 9–23)
BUN: 10 mg/dL (ref 6–24)
CO2: 23 mmol/L (ref 20–29)
Calcium: 9.1 mg/dL (ref 8.7–10.2)
Chloride: 104 mmol/L (ref 96–106)
Creatinine, Ser: 1.05 mg/dL — ABNORMAL HIGH (ref 0.57–1.00)
GFR calc Af Amer: 76 mL/min/{1.73_m2} (ref >59)
GFR calc non Af Amer: 66 mL/min/{1.73_m2} (ref >59)
Glucose: 79 mg/dL (ref 65–99)
Potassium: 4.2 mmol/L (ref 3.5–5.2)
Sodium: 138 mmol/L (ref 134–144)

## 2019-08-21 LAB — MAGNESIUM: Magnesium: 1.9 mg/dL (ref 1.6–2.3)

## 2019-08-21 LAB — MYOCARDIAL PERFUSION IMAGING
Estimated workload: 10.1 METS
Exercise duration (min): 9 min
Exercise duration (sec): 15 s
LV dias vol: 60 mL (ref 46–106)
LV sys vol: 26 mL
MPHR: 178 {beats}/min
Peak HR: 162 {beats}/min
Percent HR: 91 %
Rest HR: 82 {beats}/min
SDS: 2
SRS: 1
SSS: 3
TID: 0.95

## 2019-08-21 MED ORDER — PERFLUTREN LIPID MICROSPHERE
1.0000 mL | INTRAVENOUS | Status: AC | PRN
  Administered 2019-08-21: 1 mL via INTRAVENOUS

## 2019-08-21 MED ORDER — REGADENOSON 0.4 MG/5ML IV SOLN: 0.4000 mg | Freq: Once | INTRAVENOUS | Status: AC

## 2019-08-21 MED ORDER — TECHNETIUM TC 99M TETROFOSMIN IV KIT
31.4000 | PACK | Freq: Once | INTRAVENOUS | Status: AC | PRN
  Administered 2019-08-21: 31.4 via INTRAVENOUS
  Filled 2019-08-21: qty 32

## 2019-08-21 MED ORDER — TECHNETIUM TC 99M TETROFOSMIN IV KIT
9.2000 | PACK | Freq: Once | INTRAVENOUS | Status: AC | PRN
  Administered 2019-08-21: 9.2 via INTRAVENOUS
  Filled 2019-08-21: qty 10

## 2019-08-21 NOTE — Telephone Encounter (Signed)
Spoke with patient regarding results and recommendations.  Patient verbalizes understanding and is agreeable to plan of care. Advised patient to call back with any issues or concerns.  

## 2019-08-21 NOTE — Telephone Encounter (Signed)
-----   Message from Alver Sorrow, NP sent at 08/21/2019  7:23 AM EDT ----- Kidney function with very mildly elevated creatinine, likely some element of dehydration. Recommend adequate PO fluid intake. Electrolytes normal. Dr. Servando Salina will discuss at upcoming appointment.

## 2019-08-22 ENCOUNTER — Telehealth: Payer: Self-pay

## 2019-08-22 NOTE — Telephone Encounter (Signed)
-----   Message from Alver Sorrow, NP sent at 08/21/2019  9:32 PM EDT ----- Echocardiogram shows normal heart pumping function. No significant valvular abnormalities. Good result!

## 2019-08-22 NOTE — Telephone Encounter (Signed)
-----   Message from Alver Sorrow, NP sent at 08/21/2019 11:03 PM EDT ----- Good exercise capacity by stress test! No evidence of blockage. Normal pumping function. Good result! Will be discussed in depth during upcoming visit 08/25/19 with Dr. Servando Salina.   (Please discuss echo result as well)

## 2019-08-22 NOTE — Telephone Encounter (Signed)
Follow up  ° ° °Pt returning call  °

## 2019-08-22 NOTE — Telephone Encounter (Signed)
-----   Message from Caitlin S Walker, NP sent at 08/21/2019  9:32 PM EDT ----- Echocardiogram shows normal heart pumping function. No significant valvular abnormalities. Good result! 

## 2019-08-22 NOTE — Telephone Encounter (Signed)
Tried calling patient. No answer and no voicemail set up for me to leave a message. 

## 2019-08-22 NOTE — Telephone Encounter (Signed)
Spoke with patient regarding results and recommendation.  Patient verbalizes understanding and is agreeable to plan of care. Advised patient to call back with any issues or concerns.  

## 2019-08-22 NOTE — Telephone Encounter (Signed)
Tried calling patient. No answer voicemail is full so I could not leave a message at this time.

## 2019-08-25 ENCOUNTER — Ambulatory Visit: Payer: Self-pay | Admitting: Cardiology

## 2019-08-29 ENCOUNTER — Other Ambulatory Visit: Payer: Self-pay

## 2019-08-29 ENCOUNTER — Encounter: Payer: Self-pay | Admitting: Cardiology

## 2019-08-29 ENCOUNTER — Ambulatory Visit (INDEPENDENT_AMBULATORY_CARE_PROVIDER_SITE_OTHER): Payer: Self-pay

## 2019-08-29 ENCOUNTER — Ambulatory Visit (INDEPENDENT_AMBULATORY_CARE_PROVIDER_SITE_OTHER): Payer: Self-pay | Admitting: Cardiology

## 2019-08-29 VITALS — BP 140/94 | HR 98 | Ht 63.0 in | Wt 153.0 lb

## 2019-08-29 DIAGNOSIS — E782 Mixed hyperlipidemia: Secondary | ICD-10-CM

## 2019-08-29 DIAGNOSIS — Q210 Ventricular septal defect: Secondary | ICD-10-CM

## 2019-08-29 DIAGNOSIS — R0602 Shortness of breath: Secondary | ICD-10-CM

## 2019-08-29 DIAGNOSIS — R002 Palpitations: Secondary | ICD-10-CM

## 2019-08-29 DIAGNOSIS — I10 Essential (primary) hypertension: Secondary | ICD-10-CM

## 2019-08-29 MED ORDER — PROPRANOLOL HCL 20 MG PO TABS: 20.0000 mg | ORAL_TABLET | Freq: Three times a day (TID) | ORAL | 3 refills | Status: DC

## 2019-08-29 NOTE — Patient Instructions (Addendum)
Medication Instructions:  Take Propranolol 20 mg daily. *If you need a refill on your cardiac medications before your next appointment, please call your pharmacy*   Lab Work: None ordered If you have labs (blood work) drawn today and your tests are completely normal, you will receive your results only by: Marland Kitchen MyChart Message (if you have MyChart) OR . A paper copy in the mail If you have any lab test that is abnormal or we need to change your treatment, we will call you to review the results.   Testing/Procedures:  WHY IS MY DOCTOR PRESCRIBING ZIO? The Zio system is proven and trusted by physicians to detect and diagnose irregular heart rhythms -- and has been prescribed to hundreds of thousands of patients.  The FDA has cleared the Zio system to monitor for many different kinds of irregular heart rhythms. In a study, physicians were able to reach a diagnosis 90% of the time with the Zio system1.  You can wear the Zio monitor -- a small, discreet, comfortable patch -- during your normal day-to-day activity, including while you sleep, shower, and exercise, while it records every single heartbeat for analysis.  1Barrett, P., et al. Comparison of 24 Hour Holter Monitoring Versus 14 Day Novel Adhesive Patch Electrocardiographic Monitoring. American Journal of Medicine, 2014.  ZIO VS. HOLTER MONITORING The Zio monitor can be comfortably worn for up to 14 days. Holter monitors can be worn for 24 to 48 hours, limiting the time to record any irregular heart rhythms you may have. Zio is able to capture data for the 51% of patients who have their first symptom-triggered arrhythmia after 48 hours.1  LIVE WITHOUT RESTRICTIONS The Zio ambulatory cardiac monitor is a small, unobtrusive, and water-resistant patch--you might even forget you're wearing it. The Zio monitor records and stores every beat of your heart, whether you're sleeping, working out, or showering.  Wear the monitor for 7 days, remove  09/05/19.   Follow-Up: At [facility], you and your health needs are our priority.  As part of our continuing mission to provide you with exceptional heart care, we have created designated Provider Care Teams.  These Care Teams include your primary Cardiologist (physician) and Advanced Practice Providers (APPs -  Physician Assistants and Nurse Practitioners) who all work together to provide you with the care you need, when you need it.  We recommend signing up for the patient portal called "MyChart".  Sign up information is provided on this After Visit Summary.  MyChart is used to connect with patients for Virtual Visits (Telemedicine).  Patients are able to view lab/test results, encounter notes, upcoming appointments, etc.  Non-urgent messages can be sent to your provider as well.   To learn more about what you can do with MyChart, go to [email or web address].    Your next appointment:   2 month(s)  The format for your next appointment:   In Person  Provider:   Thomasene Ripple, DO   Other Instructions Propranolol Extended-Release Capsules What is this medicine? PROPRANOLOL (proe PRAN oh lole) is a beta blocker. It decreases the amount of work your heart has to do and helps your heart beat regularly. It treats high blood pressure and/or prevent chest pain (also called angina). This medicine may be used for other purposes; ask your health care provider or pharmacist if you have questions. COMMON BRAND NAME(S): Inderal LA, Inderal XL, InnoPran XL What should I tell my health care provider before I take this medicine? They need to know if you  have any of these conditions:  circulation problems, or blood vessel disease  diabetes  history of heart attack or heart disease, vasospastic angina  kidney disease  liver disease  lung or breathing disease, like asthma or emphysema  pheochromocytoma  slow heart rate  thyroid disease  an unusual or allergic reaction to propranolol,  other beta-blockers, medicines, foods, dyes, or preservatives  pregnant or trying to get pregnant  breast-feeding How should I use this medicine? Take this drug by mouth. Take it as directed on the prescription label at the same time every day. Do not cut, crush or chew this drug. Swallow the capsules whole. You can take it with or without food. If it upsets your stomach, take it with food. Keep taking it unless your health care provider tells you to stop. Talk to your health care provider about the use of this drug in children. Special care may be needed. Overdosage: If you think you have taken too much of this medicine contact a poison control center or emergency room at once. NOTE: This medicine is only for you. Do not share this medicine with others. What if I miss a dose? If you miss a dose, take it as soon as you can. If it is almost time for your next dose, take only that dose. Do not take double or extra doses. What may interact with this medicine? Do not take this medicine with any of the following medications:  feverfew  phenothiazines like chlorpromazine, mesoridazine, prochlorperazine, thioridazine This medicine may also interact with the following medications:  aluminum hydroxide gel  antipyrine  antiviral medicines for HIV or AIDS  barbiturates like phenobarbital  certain medicines for blood pressure, heart disease, irregular heart beat  cimetidine  ciprofloxacin  diazepam  fluconazole  haloperidol  isoniazid  medicines for cholesterol like cholestyramine or colestipol  medicines for mental depression  medicines for migraine headache like almotriptan, eletriptan, frovatriptan, naratriptan, rizatriptan, sumatriptan, zolmitriptan  NSAIDs, medicines for pain and inflammation, like ibuprofen or naproxen  phenytoin  rifampin  teniposide  theophylline  thyroid medicines  tolbutamide  warfarin  zileuton This list may not describe all possible  interactions. Give your health care provider a list of all the medicines, herbs, non-prescription drugs, or dietary supplements you use. Also tell them if you smoke, drink alcohol, or use illegal drugs. Some items may interact with your medicine. What should I watch for while using this medicine? Visit your doctor or health care professional for regular check ups. Contact your doctor right away if your symptoms worsen. Check your blood pressure and pulse rate regularly. Ask your health care professional what your blood pressure and pulse rate should be, and when you should contact them. Do not stop taking this medicine suddenly. This could lead to serious heart-related effects. You may get drowsy or dizzy. Do not drive, use machinery, or do anything that needs mental alertness until you know how this drug affects you. Do not stand or sit up quickly, especially if you are an older patient. This reduces the risk of dizzy or fainting spells. Alcohol can make you more drowsy and dizzy. Avoid alcoholic drinks. This medicine may increase blood sugar. Ask your healthcare provider if changes in diet or medicines are needed if you have diabetes. Do not treat yourself for coughs, colds, or pain while you are taking this medicine without asking your doctor or health care professional for advice. Some ingredients may increase your blood pressure. What side effects may I notice from  receiving this medicine? Side effects that you should report to your doctor or health care professional as soon as possible:  allergic reactions like skin rash, itching or hives, swelling of the face, lips, or tongue  breathing problems  cold hands or feet  difficulty sleeping, nightmares  dry peeling skin  hallucinations  muscle cramps or weakness   signs and symptoms of high blood sugar such as being more thirsty or hungry or having to urinate more than normal. You may also feel very tired or have blurry vision.  slow  heart rate  swelling of the legs and ankles  vomiting Side effects that usually do not require medical attention (report to your doctor or health care professional if they continue or are bothersome):  change in sex drive or performance  diarrhea  dry sore eyes  hair loss  nausea  weak or tired This list may not describe all possible side effects. Call your doctor for medical advice about side effects. You may report side effects to FDA at 1-800-FDA-1088. Where should I keep my medicine? Keep out of the reach of children and pets. Store at room temperature between 15 and 30 degrees C (59 and 86 degrees F). Protect from light and moisture. Keep the container tightly closed. Avoid exposure to extreme heat. Do not freeze. Throw away any unused drug after the expiration date. NOTE: This sheet is a summary. It may not cover all possible information. If you have questions about this medicine, talk to your doctor, pharmacist, or health care provider.  2020 Elsevier/Gold Standard (2018-10-28 16:23:26)

## 2019-08-29 NOTE — Progress Notes (Signed)
Cardiology Office Note:    Date:  08/29/2019   ID:  Donna Sutton, DOB 12/16/1975, MRN [id number]  PCP:  Galvin Proffer, MD  Cardiologist:  Thomasene Ripple, DO  Electrophysiologist:  None   Referring MD: Galvin Proffer, MD   Chief Complaint  Patient presents with  . Follow-up   History of Present Illness:    Donna Sutton is a 44 y.o. female with a hx of hypertension, hyperlipidemia, family history of premature coronary artery disease presented to be evaluated for chest pain and shortness of breath.  At this time I did send the patient for an echocardiogram and a nuclear stress test.  She is here today for follow-up visit.  She tells me that she has been experiencing significant palpitations and has been bothering her.  She notes that it is an abrupt onset of fast heartbeat which lasts for few seconds and resolve itself.  The frequency is more and how long the palpitations last.  She still is bothered that she still is experiencing shortness of breath on exertion.  Past Medical History:  Diagnosis Date  . Abnormal laboratory test 01/17/2017   Formatting of this note might be different from the original. Abnormal ANA speckled pattern  . Atrophy of left kidney 10/15/2017  . B12 deficiency anemia   . Back pain 06/19/2014   10/1 IMO update  . Chest pain of uncertain etiology 06/20/2019  . Chest pressure 01/03/2017  . Chronic migraine 05/23/2017  . Essential hypertension   . Generalized anxiety disorder   . Low back pain   . Lupus anticoagulant disorder (HCC)   . Migraine with aura, intractable, without status migrainosus   . Mixed hyperlipidemia   . Numbness and tingling of left side of face 01/03/2017  . Other specified hypothyroidism   . Shortness of breath 01/03/2017  . Vitamin D deficiency     Past Surgical History:  Procedure Laterality Date  . CESAREAN SECTION     X4  . OVARIAN CYST REMOVAL    . TUBAL LIGATION      Current Medications: Current Meds  Medication Sig  .  ALPRAZolam (XANAX) 1 MG tablet Take 1 mg by mouth 2 (two) times daily.  . Cholecalciferol 125 MCG (5000 UT) TABS Take by mouth.  . cyanocobalamin 1000 MCG tablet Take by mouth.  . ergocalciferol (VITAMIN D2) 1.25 MG (50000 UT) capsule Take by mouth.  . hydroxychloroquine (PLAQUENIL) 200 MG tablet Take 200 mg by mouth daily.  Marland Kitchen losartan (COZAAR) 25 MG tablet Take 1 tablet (25 mg total) by mouth daily.  . Omega-3 Fatty Acids (FISH OIL) 1000 MG CAPS Take 1,000 mg by mouth daily.  Marland Kitchen topiramate (TOPAMAX) 50 MG tablet Take 50 mg by mouth daily.  Marland Kitchen vortioxetine HBr (TRINTELLIX) 20 MG TABS tablet Take 20 mg by mouth daily.     Allergies:   Morphine, Other, and Piperacillin-tazobactam in dex   Social History   Socioeconomic History  . Marital status: Unknown    Spouse name: Not on file  . Number of children: Not on file  . Years of education: Not on file  . Highest education level: Not on file  Occupational History  . Not on file  Tobacco Use  . Smoking status: Never Smoker  . Smokeless tobacco: Never Used  Substance and Sexual Activity  . Alcohol use: Never  . Drug use: Never  . Sexual activity: Not on file  Other Topics Concern  . Not on file  Social History  Narrative  . Not on file   Social Determinants of Health   Financial Resource Strain:   . Difficulty of Paying Living Expenses:   Food Insecurity:   . Worried About Programme researcher, broadcasting/film/video in the Last Year:   . Barista in the Last Year:   Transportation Needs:   . Freight forwarder (Medical):   Marland Kitchen Lack of Transportation (Non-Medical):   Physical Activity:   . Days of Exercise per Week:   . Minutes of Exercise per Session:   Stress:   . Feeling of Stress :   Social Connections:   . Frequency of Communication with Friends and Family:   . Frequency of Social Gatherings with Friends and Family:   . Attends Religious Services:   . Active Member of Clubs or Organizations:   . Attends Banker  Meetings:   Marland Kitchen Marital Status:      Family History: The patient's family history includes Anxiety disorder in her mother; COPD in her mother; Kidney failure in her father.  ROS:   Review of Systems  Constitution: Negative for decreased appetite, fever and weight gain.  HENT: Negative for congestion, ear discharge, hoarse voice and sore throat.   Eyes: Negative for discharge, redness, vision loss in right eye and visual halos.  Cardiovascular: Negative for chest pain, dyspnea on exertion, leg swelling, orthopnea and palpitations.  Respiratory: Negative for cough, hemoptysis, shortness of breath and snoring.   Endocrine: Negative for heat intolerance and polyphagia.  Hematologic/Lymphatic: Negative for bleeding problem. Does not bruise/bleed easily.  Skin: Negative for flushing, nail changes, rash and suspicious lesions.  Musculoskeletal: Negative for arthritis, joint pain, muscle cramps, myalgias, neck pain and stiffness.  Gastrointestinal: Negative for abdominal pain, bowel incontinence, diarrhea and excessive appetite.  Genitourinary: Negative for decreased libido, genital sores and incomplete emptying.  Neurological: Negative for brief paralysis, focal weakness, headaches and loss of balance.  Psychiatric/Behavioral: Negative for altered mental status, depression and suicidal ideas.  Allergic/Immunologic: Negative for HIV exposure and persistent infections.    EKGs/Labs/Other Studies Reviewed:    The following studies were reviewed today:   EKG:  The ekg ordered today demonstrates   Pharmacologic stress test:08/21/2019  Good exercise capacity, achieved 10.1 METS  Upsloping ST segment depression. No evidence of ischemia on EKG  Nuclear stress EF: 57%.  The left ventricular ejection fraction is normal (55-65%).  The study is normal.  This is a low risk study.   Recent Labs: 08/20/2019: BUN 10; Creatinine, Ser 1.05; Magnesium 1.9; Potassium 4.2; Sodium 138  Recent Lipid  Panel No results found for: CHOL, TRIG, HDL, CHOLHDL, VLDL, LDLCALC, LDLDIRECT  Physical Exam:    VS:  BP (!) 140/94   Pulse 98   Ht 5\' 3"  (1.6 m)   Wt 153 lb (69.4 kg)   SpO2 98%   BMI 27.10 kg/m     Wt Readings from Last 3 Encounters:  08/29/19 153 lb (69.4 kg)  06/20/19 148 lb (67.1 kg)     GEN: Well nourished, well developed in no acute distress HEENT: Normal NECK: No JVD; No carotid bruits LYMPHATICS: No lymphadenopathy CARDIAC: S1S2 noted,RRR, no murmurs, rubs, gallops RESPIRATORY:  Clear to auscultation without rales, wheezing or rhonchi  ABDOMEN: Soft, non-tender, non-distended, +bowel sounds, no guarding. EXTREMITIES: No edema, No cyanosis, no clubbing MUSCULOSKELETAL:  No deformity  SKIN: Warm and dry NEUROLOGIC:  Alert and oriented x 3, non-focal PSYCHIATRIC:  Normal affect, good insight  ASSESSMENT:  1. Palpitations   2. VSD (ventricular septal defect)   3. Shortness of breath   4. Essential hypertension   5. Mixed hyperlipidemia    PLAN:     1.  VSD-I did review her echocardiogram which show evidence of membranous VSD.  It appears to be measuring 1.03 centimeters.  There is no evidence of pulmonary hypertension in her right ventricle size appeared to be normal.  I will refer her to our structural clinic at which time I am going to defer the decision for right heart cath possible Qp/Qs to my colleague in the structural clinic.  2.  In terms of her palpitations she is uncomfortable and is pretty symptomatic based on the way she describes this.  I am going to start her on propanolol 3 times daily hoping that she may get some relief with this also in the setting of her history of significant anxiety.  She will monitor for 7 days to rule out any cardiac arrhythmia that may be contributing to this.  3.  Hypertension-blood pressure does not appear to be controlled on the losartan 25 mg daily.  Hoping with the propanolol she can get some relief of the blood  pressure if not will titrate losartan up as much as appropriate.  The patient is in agreement with the above plan. The patient left the office in stable condition.  The patient will follow up in 2 months or sooner if needed.   Medication Adjustments/Labs and Tests Ordered: Current medicines are reviewed at length with the patient today.  Concerns regarding medicines are outlined above.  Orders Placed This Encounter  Procedures  . Ambulatory referral to Structural Heart/Valve Clinic (only at [facility])  . LONG TERM MONITOR (3-14 DAYS)   Meds ordered this encounter  Medications  . propranolol (INDERAL) 20 MG tablet    Sig: Take 1 tablet (20 mg total) by mouth 3 (three) times daily.    Dispense:  30 tablet    Refill:  3    Patient Instructions  Medication Instructions:  Take Propranolol 20 mg daily. *If you need a refill on your cardiac medications before your next appointment, please call your pharmacy*   Lab Work: None ordered If you have labs (blood work) drawn today and your tests are completely normal, you will receive your results only by: Marland Kitchen MyChart Message (if you have MyChart) OR . A paper copy in the mail If you have any lab test that is abnormal or we need to change your treatment, we will call you to review the results.   Testing/Procedures:  WHY IS MY DOCTOR PRESCRIBING ZIO? The Zio system is proven and trusted by physicians to detect and diagnose irregular heart rhythms -- and has been prescribed to hundreds of thousands of patients.  The FDA has cleared the Zio system to monitor for many different kinds of irregular heart rhythms. In a study, physicians were able to reach a diagnosis 90% of the time with the Zio system1.  You can wear the Zio monitor -- a small, discreet, comfortable patch -- during your normal day-to-day activity, including while you sleep, shower, and exercise, while it records every single heartbeat for analysis.  1Barrett, P., et al.  Comparison of 24 Hour Holter Monitoring Versus 14 Day Novel Adhesive Patch Electrocardiographic Monitoring. American Journal of Medicine, 2014.  ZIO VS. HOLTER MONITORING The Zio monitor can be comfortably worn for up to 14 days. Holter monitors can be worn for 24 to 48 hours, limiting the  time to record any irregular heart rhythms you may have. Zio is able to capture data for the 51% of patients who have their first symptom-triggered arrhythmia after 48 hours.1  LIVE WITHOUT RESTRICTIONS The Zio ambulatory cardiac monitor is a small, unobtrusive, and water-resistant patch--you might even forget you're wearing it. The Zio monitor records and stores every beat of your heart, whether you're sleeping, working out, or showering.  Wear the monitor for 7 days, remove 09/05/19.   Follow-Up: At [facility], you and your health needs are our priority.  As part of our continuing mission to provide you with exceptional heart care, we have created designated Provider Care Teams.  These Care Teams include your primary Cardiologist (physician) and Advanced Practice Providers (APPs -  Physician Assistants and Nurse Practitioners) who all work together to provide you with the care you need, when you need it.  We recommend signing up for the patient portal called "MyChart".  Sign up information is provided on this After Visit Summary.  MyChart is used to connect with patients for Virtual Visits (Telemedicine).  Patients are able to view lab/test results, encounter notes, upcoming appointments, etc.  Non-urgent messages can be sent to your provider as well.   To learn more about what you can do with MyChart, go to [email or web address].    Your next appointment:   2 month(s)  The format for your next appointment:   In Person  Provider:   Thomasene RippleKardie Kacper Cartlidge, DO   Other Instructions Propranolol Extended-Release Capsules What is this medicine? PROPRANOLOL (proe PRAN oh lole) is a beta blocker. It decreases the  amount of work your heart has to do and helps your heart beat regularly. It treats high blood pressure and/or prevent chest pain (also called angina). This medicine may be used for other purposes; ask your health care provider or pharmacist if you have questions. COMMON BRAND NAME(S): Inderal LA, Inderal XL, InnoPran XL What should I tell my health care provider before I take this medicine? They need to know if you have any of these conditions:  circulation problems, or blood vessel disease  diabetes  history of heart attack or heart disease, vasospastic angina  kidney disease  liver disease  lung or breathing disease, like asthma or emphysema  pheochromocytoma  slow heart rate  thyroid disease  an unusual or allergic reaction to propranolol, other beta-blockers, medicines, foods, dyes, or preservatives  pregnant or trying to get pregnant  breast-feeding How should I use this medicine? Take this drug by mouth. Take it as directed on the prescription label at the same time every day. Do not cut, crush or chew this drug. Swallow the capsules whole. You can take it with or without food. If it upsets your stomach, take it with food. Keep taking it unless your health care provider tells you to stop. Talk to your health care provider about the use of this drug in children. Special care may be needed. Overdosage: If you think you have taken too much of this medicine contact a poison control center or emergency room at once. NOTE: This medicine is only for you. Do not share this medicine with others. What if I miss a dose? If you miss a dose, take it as soon as you can. If it is almost time for your next dose, take only that dose. Do not take double or extra doses. What may interact with this medicine? Do not take this medicine with any of the following medications:  feverfew  phenothiazines like chlorpromazine, mesoridazine, prochlorperazine, thioridazine This medicine may also  interact with the following medications:  aluminum hydroxide gel  antipyrine  antiviral medicines for HIV or AIDS  barbiturates like phenobarbital  certain medicines for blood pressure, heart disease, irregular heart beat  cimetidine  ciprofloxacin  diazepam  fluconazole  haloperidol  isoniazid  medicines for cholesterol like cholestyramine or colestipol  medicines for mental depression  medicines for migraine headache like almotriptan, eletriptan, frovatriptan, naratriptan, rizatriptan, sumatriptan, zolmitriptan  NSAIDs, medicines for pain and inflammation, like ibuprofen or naproxen  phenytoin  rifampin  teniposide  theophylline  thyroid medicines  tolbutamide  warfarin  zileuton This list may not describe all possible interactions. Give your health care provider a list of all the medicines, herbs, non-prescription drugs, or dietary supplements you use. Also tell them if you smoke, drink alcohol, or use illegal drugs. Some items may interact with your medicine. What should I watch for while using this medicine? Visit your doctor or health care professional for regular check ups. Contact your doctor right away if your symptoms worsen. Check your blood pressure and pulse rate regularly. Ask your health care professional what your blood pressure and pulse rate should be, and when you should contact them. Do not stop taking this medicine suddenly. This could lead to serious heart-related effects. You may get drowsy or dizzy. Do not drive, use machinery, or do anything that needs mental alertness until you know how this drug affects you. Do not stand or sit up quickly, especially if you are an older patient. This reduces the risk of dizzy or fainting spells. Alcohol can make you more drowsy and dizzy. Avoid alcoholic drinks. This medicine may increase blood sugar. Ask your healthcare provider if changes in diet or medicines are needed if you have diabetes. Do not  treat yourself for coughs, colds, or pain while you are taking this medicine without asking your doctor or health care professional for advice. Some ingredients may increase your blood pressure. What side effects may I notice from receiving this medicine? Side effects that you should report to your doctor or health care professional as soon as possible:  allergic reactions like skin rash, itching or hives, swelling of the face, lips, or tongue  breathing problems  cold hands or feet  difficulty sleeping, nightmares  dry peeling skin  hallucinations  muscle cramps or weakness   signs and symptoms of high blood sugar such as being more thirsty or hungry or having to urinate more than normal. You may also feel very tired or have blurry vision.  slow heart rate  swelling of the legs and ankles  vomiting Side effects that usually do not require medical attention (report to your doctor or health care professional if they continue or are bothersome):  change in sex drive or performance  diarrhea  dry sore eyes  hair loss  nausea  weak or tired This list may not describe all possible side effects. Call your doctor for medical advice about side effects. You may report side effects to FDA at 1-800-FDA-1088. Where should I keep my medicine? Keep out of the reach of children and pets. Store at room temperature between 15 and 30 degrees C (59 and 86 degrees F). Protect from light and moisture. Keep the container tightly closed. Avoid exposure to extreme heat. Do not freeze. Throw away any unused drug after the expiration date. NOTE: This sheet is a summary. It may not cover all possible information. If you have questions  about this medicine, talk to your doctor, pharmacist, or health care provider.  2020 Elsevier/Gold Standard (2018-10-28 16:23:26)      Adopting a Healthy Lifestyle.  Know what a healthy weight is for you (roughly BMI <25) and aim to maintain this   Aim for 7+  servings of fruits and vegetables daily   65-80+ fluid ounces of water or unsweet tea for healthy kidneys   Limit to max 1 drink of alcohol per day; avoid smoking/tobacco   Limit animal fats in diet for cholesterol and heart health - choose grass fed whenever available   Avoid highly processed foods, and foods high in saturated/trans fats   Aim for low stress - take time to unwind and care for your mental health   Aim for 150 min of moderate intensity exercise weekly for heart health, and weights twice weekly for bone health   Aim for 7-9 hours of sleep daily   When it comes to diets, agreement about the perfect plan isnt easy to find, even among the experts. Experts at the [employer] developed an idea known as the Healthy Eating Plate. Just imagine a plate divided into logical, healthy portions.   The emphasis is on diet quality:   Load up on vegetables and fruits - one-half of your plate: Aim for color and variety, and remember that potatoes dont count.   Go for whole grains - one-quarter of your plate: Whole wheat, barley, wheat berries, quinoa, oats, brown rice, and foods made with them. If you want pasta, go with whole wheat pasta.   Protein power - one-quarter of your plate: Fish, chicken, beans, and nuts are all healthy, versatile protein sources. Limit red meat.   The diet, however, does go beyond the plate, offering a few other suggestions.   Use healthy plant oils, such as olive, canola, soy, corn, sunflower and peanut. Check the labels, and avoid partially hydrogenated oil, which have unhealthy trans fats.   If youre thirsty, drink water. Coffee and tea are good in moderation, but skip sugary drinks and limit milk and dairy products to one or two daily servings.   The type of carbohydrate in the diet is more important than the amount. Some sources of carbohydrates, such as vegetables, fruits, whole grains, and beans-are healthier than others.    Finally, stay active  Signed, Berniece Salines, DO  08/29/2019 5:16 PM    [facility]

## 2019-09-08 ENCOUNTER — Telehealth: Payer: Self-pay | Admitting: Cardiology

## 2019-09-08 NOTE — Telephone Encounter (Signed)
Attempted to call back to arrange consult - VM has not been set up yet. Unable to leave message.  Will try again later.

## 2019-09-08 NOTE — Telephone Encounter (Signed)
Attempted to call pt, but no way to leave VM. Will attempt later.

## 2019-09-08 NOTE — Telephone Encounter (Signed)
Patient is calling to follow up in regards to scheduling heart cath. Please call.

## 2019-09-08 NOTE — Telephone Encounter (Signed)
Pt calling today to discuss her recent referral to structural heart. She states she has been mandated to return to work with the threat of loosing her job if she does not take mandatory overtime. She works for the [employer] and is on her feet through the majority of the day. She is requesting a sooner appointment with Dr. Excell Seltzer to evaluate her VSD. She is feeling very poor, SOB, easily fatigued and sometimes light headed. She states she recently "passed out" in a bathroom stall at [employer].   I explained to her the nature of referrals and how sometimes specialist take longer to get scheduled. She states she understands but hoped there was a way she could be fit it sooner. She is anxious about taking more time off of work and fears "if I don't get this fixed soon, I am afraid I'll loose my job."  I advised her I would forward to Dr. Excell Seltzer and his RN to review his schedule and look for possible options.  I did offer her an option to discuss with Dr. Servando Salina to excuse her from work or request frequent breaks. She states if she takes another leave, she will be terminated. There is a possibility her supervisor will allow her to have more frequent breaks.   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 They have limited access to take calls at work. I was unable to leave her a VM today and I made her aware of this. I did text her a message asking her to return my call. I encouraged her to set up her MyChart account for easier communication. A link was sent and she will try to set it up.

## 2019-09-11 ENCOUNTER — Telehealth: Payer: Self-pay | Admitting: Emergency Medicine

## 2019-09-11 ENCOUNTER — Telehealth: Payer: Self-pay | Admitting: Cardiology

## 2019-09-11 NOTE — Telephone Encounter (Signed)
Patient called complaining of intermittent chest pain that has been getting worse today. She reports increased shortness of breath along with left arm tingling. She also reports som redness to both arms but mainly left arm. Patient advised to go be seen in the emergency department. She verbally understood. She also had questions about her referral to Dr. Excell Seltzer I told her his nurse tried to contact her on 09/08/19 and they were not able to get her. Confirmed her phone number will share it with Dr. Earmon Phoenix nurse. Patient plans to go to the emergency department, no further questions.

## 2019-09-11 NOTE — Telephone Encounter (Signed)
Please see additional phone call.  

## 2019-09-11 NOTE — Telephone Encounter (Signed)
Pt c/o of Chest Pain: STAT if CP now or developed within 24 hours  1. Are you having CP right now? yes  2. Are you experiencing any other symptoms (ex. SOB, nausea, vomiting, sweating)? SOB, sweating,  3. How long have you been experiencing CP? Since she started being seen at the office, but getting much worse  4. Is your CP continuous or coming and going? Comes and goes  5. Have you taken Nitroglycerin? no   Patient states she is having chest pain and her upper body is getting shaky. She states she has been finding herself falling asleep often and is having SOB.  ?

## 2019-09-12 ENCOUNTER — Telehealth: Payer: Self-pay | Admitting: Cardiology

## 2019-09-12 ENCOUNTER — Telehealth: Payer: Self-pay | Admitting: Cardiovascular Disease

## 2019-09-12 NOTE — Telephone Encounter (Signed)
Attempted to call patient. Unable to leave voicemail.  

## 2019-09-12 NOTE — Telephone Encounter (Signed)
Spoke with the patient and advised her that Dr. Servando Salina recommends that she go to the ER at [facility]. Patient verbalized understanding and states that she will try her best.

## 2019-09-12 NOTE — Telephone Encounter (Signed)
It will be best she goes to the ED at [facility]. Let her know that if she is admitted the cardiologist that will see her will have access to my records. Also let her know that they are  my partners and I will be comfortable with anyone who see her there.

## 2019-09-12 NOTE — Telephone Encounter (Signed)
Follow up  Pt is calling back and says her symptoms are getting worse and wants to get something scheduled soon  I advised her the nurse would contact her as soon as they are able   Please advise

## 2019-09-12 NOTE — Telephone Encounter (Signed)
Attempted to call patient back. Unable to leave voicemail.  

## 2019-09-12 NOTE — Telephone Encounter (Signed)
Pt c/o Shortness Of Breath: STAT if SOB developed within the last 24 hours or pt is noticeably SOB on the phone  1. Are you currently SOB (can you hear that pt is SOB on the phone)?  Pt says she is but it is not noticeable on the phone   2. How long have you been experiencing SOB?   3. Are you SOB when sitting or when up moving around?  All the time  4. Are you currently experiencing any other symptoms? Pt says she wakes up from being asleep but doesn't know how she fell asleep. Weakness, she is constantly fatigued, has sharp pains that come and go   Pt really wants to be seen by Dr. Servando Salina today if possible. She called earlier in the week and was told to go to the hospital but could not find a babysitter. She also feels like she can not go to work because she feels so miserable

## 2019-09-12 NOTE — Telephone Encounter (Signed)
Patient returning call.

## 2019-09-12 NOTE — Telephone Encounter (Signed)
Spoke with the patient who states that she is having worsening shortness of breath and chest pain. She also complains of numbness and tingling in her left arm and face. She states that her arms are splotchy and red. She is very fatigued and states that she has no energy. She also reports that she keeps falling asleep without realizing it. She spoke with Vanessa Anaconda, RN yesterday and was advised to go to the ER because of these symptoms. Patient reports that she was unable to go because she has a special needs child and no one to watch him. Patient is requesting to be seen by Dr. Servando Salina today. I advised the patient that there were no openings with Dr. Servando Salina today and that she should go to the ER. Patient again states that she is unable to do so. I advised her that she should at least call EMS so that she can be evaluated by them ASAP. Patient remains hesitant and would rather be seen in the office.

## 2019-09-12 NOTE — Telephone Encounter (Signed)
Donna Sutton is returning Donna Sutton's call to schedule her referral with Dr. Copper. Please advise.

## 2019-09-14 ENCOUNTER — Emergency Department (HOSPITAL_COMMUNITY): Payer: PRIVATE HEALTH INSURANCE

## 2019-09-14 ENCOUNTER — Other Ambulatory Visit: Payer: Self-pay

## 2019-09-14 ENCOUNTER — Encounter (HOSPITAL_COMMUNITY): Payer: Self-pay | Admitting: Emergency Medicine

## 2019-09-14 ENCOUNTER — Emergency Department (HOSPITAL_COMMUNITY)
Admission: EM | Admit: 2019-09-14 | Discharge: 2019-09-15 | Disposition: A | Discharge status: Home / Self Care | Payer: PRIVATE HEALTH INSURANCE | Attending: Emergency Medicine | Admitting: Emergency Medicine

## 2019-09-14 DIAGNOSIS — R200 Anesthesia of skin: Secondary | ICD-10-CM | POA: Insufficient documentation

## 2019-09-14 DIAGNOSIS — E039 Hypothyroidism, unspecified: Secondary | ICD-10-CM | POA: Insufficient documentation

## 2019-09-14 DIAGNOSIS — I10 Essential (primary) hypertension: Secondary | ICD-10-CM | POA: Insufficient documentation

## 2019-09-14 DIAGNOSIS — R079 Chest pain, unspecified: Secondary | ICD-10-CM | POA: Insufficient documentation

## 2019-09-14 LAB — BASIC METABOLIC PANEL
Anion gap: 7 (ref 5–15)
BUN: 11 mg/dL (ref 6–20)
CO2: 24 mmol/L (ref 22–32)
Calcium: 9.2 mg/dL (ref 8.9–10.3)
Chloride: 106 mmol/L (ref 98–111)
Creatinine, Ser: 1.02 mg/dL — ABNORMAL HIGH (ref 0.44–1.00)
GFR calc Af Amer: >60 mL/min (ref >60)
GFR calc non Af Amer: >60 mL/min (ref >60)
Glucose, Bld: 104 mg/dL — ABNORMAL HIGH (ref 70–99)
Potassium: 4 mmol/L (ref 3.5–5.1)
Sodium: 137 mmol/L (ref 135–145)

## 2019-09-14 LAB — CBC
HCT: 40.9 % (ref 36.0–46.0)
Hemoglobin: 12.8 g/dL (ref 12.0–15.0)
MCH: 28.1 pg (ref 26.0–34.0)
MCHC: 31.3 g/dL (ref 30.0–36.0)
MCV: 89.9 fL (ref 80.0–100.0)
Platelets: 374 10*3/uL (ref 150–400)
RBC: 4.55 MIL/uL (ref 3.87–5.11)
RDW: 13.4 % (ref 11.5–15.5)
WBC: 11.3 10*3/uL — ABNORMAL HIGH (ref 4.0–10.5)
nRBC: 0 % (ref 0.0–0.2)

## 2019-09-14 LAB — TROPONIN I (HIGH SENSITIVITY): Troponin I (High Sensitivity): <2 ng/L (ref <18)

## 2019-09-14 LAB — I-STAT BETA HCG BLOOD, ED (MC, WL, AP ONLY): I-stat hCG, quantitative: <5 m[IU]/mL (ref <5)

## 2019-09-14 MED ORDER — SODIUM CHLORIDE 0.9% FLUSH: 3.0000 mL | Freq: Once | INTRAVENOUS | Status: DC

## 2019-09-14 NOTE — ED Triage Notes (Signed)
C/o L sided chest pain, L arm numbness, SOB, and feeling fatigued x 2 months.  States symptoms worse over the last week.  No arm drift.

## 2019-09-14 NOTE — ED Provider Notes (Signed)
[facility]ENC[facility] DEPT [facility] Emergency Department Provider Note MRN:  [id number]  Arrival date & time: 09/14/19     Chief Complaint   Chest Pain   History of Present Illness   Donna Sutton is a 44 y.o. year-old female with a history of hypertension presenting to the ED with chief complaint of chest pain.  2 months of persistent chest pain, left side of the chest.  On and off.  Described as sharp, mild in severity.  Has been seeing cardiology.  Here because she wants answers.  Denies headache or vision change, no shortness of breath, no dizziness or diaphoresis, no nausea or vomiting, no abdominal pain, has also been experiencing numbness to the left side of her face, left arm, left leg for 2 months.  Has been told that she needs an MRI.  Review of Systems  A complete 10 system review of systems was obtained and all systems are negative except as noted in the HPI and PMH.   Patient's Health History    Past Medical History:  Diagnosis Date  . Abnormal laboratory test 01/17/2017   Formatting of this note might be different from the original. Abnormal ANA speckled pattern  . Atrophy of left kidney 10/15/2017  . B12 deficiency anemia   . Back pain 06/19/2014   10/1 IMO update  . Chest pain of uncertain etiology 06/20/2019  . Chest pressure 01/03/2017  . Chronic migraine 05/23/2017  . Essential hypertension   . Generalized anxiety disorder   . Low back pain   . Lupus anticoagulant disorder (HCC)   . Migraine with aura, intractable, without status migrainosus   . Mixed hyperlipidemia   . Numbness and tingling of left side of face 01/03/2017  . Other specified hypothyroidism   . Shortness of breath 01/03/2017  . Vitamin D deficiency     Past Surgical History:  Procedure Laterality Date  . CESAREAN SECTION     X4  . OVARIAN CYST REMOVAL    . TUBAL LIGATION      Family History  Problem Relation Age of Onset  . COPD Mother   . Anxiety disorder Mother   . Kidney failure  Father     Social History   Socioeconomic History  . Marital status: Unknown    Spouse name: Not on file  . Number of children: Not on file  . Years of education: Not on file  . Highest education level: Not on file  Occupational History  . Not on file  Tobacco Use  . Smoking status: Never Smoker  . Smokeless tobacco: Never Used  Substance and Sexual Activity  . Alcohol use: Never  . Drug use: Never  . Sexual activity: Not on file  Other Topics Concern  . Not on file  Social History Narrative  . Not on file   Social Determinants of Health   Financial Resource Strain:   . Difficulty of Paying Living Expenses:   Food Insecurity:   . Worried About Programme researcher, broadcasting/film/video in the Last Year:   . Barista in the Last Year:   Transportation Needs:   . Freight forwarder (Medical):   Marland Kitchen Lack of Transportation (Non-Medical):   Physical Activity:   . Days of Exercise per Week:   . Minutes of Exercise per Session:   Stress:   . Feeling of Stress :   Social Connections:   . Frequency of Communication with Friends and Family:   . Frequency of Social Gatherings with  Friends and Family:   . Attends Religious Services:   . Active Member of Clubs or Organizations:   . Attends Banker Meetings:   Marland Kitchen Marital Status:   Intimate Partner Violence:   . Fear of Current or Ex-Partner:   . Emotionally Abused:   Marland Kitchen Physically Abused:   . Sexually Abused:      Physical Exam   Vitals:   09/14/19 2124 09/14/19 2245  BP: 137/77 (!) 167/87  Pulse: 78 92  Resp: 20 14  Temp:    SpO2: 100% 100%    CONSTITUTIONAL: Well-appearing, NAD NEURO:  Alert and oriented x 3, normal and symmetric strength, subjective decreased sensation to left face, left arm, left leg, no visual field cuts, no aphasia, no neglect EYES:  eyes equal and reactive ENT/NECK:  no LAD, no JVD CARDIO: Regular rate, well-perfused, normal S1 and S2 PULM:  CTAB no wheezing or rhonchi GI/GU:  normal bowel  sounds, non-distended, non-tender MSK/SPINE:  No gross deformities, no edema SKIN:  no rash, atraumatic PSYCH:  Appropriate speech and behavior  *Additional and/or pertinent findings included in MDM below  Diagnostic and Interventional Summary    EKG Interpretation  Date/Time:  Sunday September 14 2019 18:50:59 EDT Ventricular Rate:  84 PR Interval:  112 QRS Duration: 66 QT Interval:  342 QTC Calculation: 404 R Axis:   60 Text Interpretation: Normal sinus rhythm Normal ECG Confirmed by Kennis Carina [phone]) on 09/14/2019 10:37:17 PM      Labs Reviewed  CBC - Abnormal; Notable for the following components:      Result Value   WBC 11.3 (*)    All other components within normal limits  BASIC METABOLIC PANEL  I-STAT BETA HCG BLOOD, ED ([facility], [facility], [facility] ONLY)  TROPONIN I (HIGH SENSITIVITY)    DG Chest 2 View  Final Result    MR Brain W and Wo Contrast    (Results Pending)    Medications  sodium chloride flush (NS) 0.9 % injection 3 mL (has no administration in time range)     Procedures  /  Critical Care Procedures  ED Course and Medical Decision Making  I have reviewed the triage vital signs, the nursing notes, and pertinent available records from the EMR.  Listed above are laboratory and imaging tests that I personally ordered, reviewed, and interpreted and then considered in my medical decision making (see below for details).      2 months of chest pain, also with 2 months of left-sided numbness.  Doubt ACS, patient is PERC negative, doubt PE.  Dissection is of course considered given the presence of chest pain with neurological deficit, however I find this to be very unlikely given the chronicity.  Will pay attention to the mediastinal width on chest x-ray, however at this time do not feel the need for CT dissection study.  We will obtain laboratory evaluation and troponin, and patient does need MRI to evaluate for this numbness.  Will do with and without contrast to evaluate for  both stroke and MS.  Signed out to oncoming provider at shift change.  Suspect with a negative work-up patient would be appropriate for discharge with close follow-up with cardiology and neurology.    Elmer Sow. Pilar Plate, MD [facility] Emergency Medicine [facility] [email or web address]  Final Clinical Impressions(s) / ED Diagnoses     ICD-10-CM   1. Chest pain, unspecified type  R07.9   2. Numbness  R20.0     ED Discharge  Orders    None       Discharge Instructions Discussed with and Provided to Patient:   Discharge Instructions   None       Maudie Flakes, MD 09/14/19 2315

## 2019-09-15 ENCOUNTER — Ambulatory Visit (INDEPENDENT_AMBULATORY_CARE_PROVIDER_SITE_OTHER): Payer: Self-pay | Admitting: Cardiology

## 2019-09-15 ENCOUNTER — Telehealth: Payer: Self-pay

## 2019-09-15 ENCOUNTER — Emergency Department (HOSPITAL_COMMUNITY): Payer: PRIVATE HEALTH INSURANCE

## 2019-09-15 ENCOUNTER — Encounter: Payer: Self-pay | Admitting: Cardiology

## 2019-09-15 VITALS — BP 137/84 | HR 106 | Ht 63.0 in | Wt 153.8 lb

## 2019-09-15 DIAGNOSIS — R002 Palpitations: Secondary | ICD-10-CM

## 2019-09-15 DIAGNOSIS — Q210 Ventricular septal defect: Secondary | ICD-10-CM

## 2019-09-15 DIAGNOSIS — E782 Mixed hyperlipidemia: Secondary | ICD-10-CM

## 2019-09-15 DIAGNOSIS — R0600 Dyspnea, unspecified: Secondary | ICD-10-CM

## 2019-09-15 DIAGNOSIS — R0609 Other forms of dyspnea: Secondary | ICD-10-CM | POA: Insufficient documentation

## 2019-09-15 MED ORDER — POTASSIUM CHLORIDE CRYS ER 20 MEQ PO TBCR
20.0000 meq | EXTENDED_RELEASE_TABLET | Freq: Every day | ORAL | 3 refills | Status: AC
Start: 2019-09-15 — End: ?

## 2019-09-15 MED ORDER — FUROSEMIDE 20 MG PO TABS: 20.0000 mg | ORAL_TABLET | Freq: Every day | ORAL | 3 refills | Status: AC

## 2019-09-15 NOTE — Patient Instructions (Signed)
Medication Instructions:  Your physician has recommended you make the following change in your medication:  START: Furosemide 20 mg take one tablet by mouth daily.  START: Potassium 20 meq take one tablet by mouth daily.  *If you need a refill on your cardiac medications before your next appointment, please call your pharmacy*   Lab Work: Your physician recommends that you return for lab work on: 09/24/19 or 09/25/19 BMP, CBC If you have labs (blood work) drawn today and your tests are completely normal, you will receive your results only by: Marland Kitchen MyChart Message (if you have MyChart) OR . A paper copy in the mail If you have any lab test that is abnormal or we need to change your treatment, we will call you to review the results.   Testing/Procedures: You are scheduled for a TEE on 09/30/19 with Dr. Duke Salvia.  Please arrive at the [facility] (Main Entrance A) at [facility]: [address] [place], [place] [address] at 7:30 am.  DIET: Nothing to eat or drink after midnight except a sip of water with medications (see medication instructions below)  You must have a responsible person to drive you home and stay in the waiting area during your procedure. Failure to do so could result in cancellation.  Bring your insurance cards.  *Special Note: Every effort is made to have your procedure done on time. Occasionally there are emergencies that occur at the hospital that may cause delays. Please be patient if a delay does occur.      [facility] CARDIOVASCULAR DIVISION [facility] AT [place] [address] [place] [address] Dept: [phone] Loc: [phone]  Donna Sutton  09/15/2019  You are scheduled for a Cardiac Catheterization on Wednesday, June 30 with Dr. Tonny Bollman.  1. Please arrive at the [facility] (Main Entrance A) at [facility]: [address] [place], [place] [address] at 8:30 AM (This time is two hours before  your procedure to ensure your preparation). Free valet parking service is available.   Special note: Every effort is made to have your procedure done on time. Please understand that emergencies sometimes delay scheduled procedures.  2. Diet: Do not eat solid foods after midnight.  The patient may have clear liquids until 5am upon the day of the procedure.  4. Medication instructions in preparation for your procedure:   Contrast Allergy: No   Hold your Losartan on the morning of the procedure.   5. Plan for one night stay--bring personal belongings. 6. Bring a current list of your medications and current insurance cards. 7. You MUST have a responsible person to drive you home. 8. Someone MUST be with you the first 24 hours after you arrive home or your discharge will be delayed. 9. Please wear clothes that are easy to get on and off and wear slip-on shoes.  Thank you for allowing [place] to care for you!   -- [facility]       Follow-Up: At [facility], you and your health needs are our priority.  As part of our continuing mission to provide you with exceptional heart care, we have created designated Provider Care Teams.  These Care Teams include your primary Cardiologist (physician) and Advanced Practice Providers (APPs -  Physician Assistants and Nurse Practitioners) who all work together to provide you with the care you need, when you need it.  We recommend signing up for the patient portal called "MyChart".  Sign up information is provided on this After Visit Summary.  MyChart is used to connect with patients for Virtual Visits (Telemedicine).  Patients are able to view lab/test results, encounter notes, upcoming appointments, etc.  Non-urgent messages can be sent to your provider as well.   To learn more about what you can do with MyChart, go to [email or web address].    Your next appointment:   1 month(s)  The format for your next appointment:    In Person  Provider:   Berniece Salines, DO   Other Instructions

## 2019-09-15 NOTE — Progress Notes (Signed)
Cardiology Office Note:    Date:  09/15/2019   ID:  Donna Sutton, DOB 01/14/1976, MRN [id number]  PCP:  Galvin ProfferHague, Imran P, MD  Cardiologist:  Thomasene RippleKardie Yichen Gilardi, DO  Electrophysiologist:  None   Referring MD: Galvin ProfferHague, Imran P, MD   " I was short of breath"   History of Present Illness:    Donna Sutton is a 44 y.o. female with a hx of hypertension, membranous VSD, hyperlipidemia presents today for follow-up visit.  I saw the patient on 11/29/2019 at that time she reported that she has been experiencing palpitations.  At this moment patient is in visiting in addition her echo was reviewed which showed of membranous VSD.  Referred the patient to see structural heart team but she has not been able to do so.  She called multiple times office requesting to be seen for shortness of breath.  Fortunately we will unable to see her on Friday however asked the patient to go to the emergency department to be evaluated.  Patient tells me that she has been also experiencing shortness of breath left-sided numbness.  While she was in emergency department she did get an MRI which showed no abnormalities.  Is discharged home.  Patient tells me that the shortness of breath has persisted and at times she feels like she is having presyncope episodes and with climbing and working she is unable to perform her work duties.  She was very emotional and teary during this visit.    Past Medical History:  Diagnosis Date  . Abnormal laboratory test 01/17/2017   Formatting of this note might be different from the original. Abnormal ANA speckled pattern  . Atrophy of left kidney 10/15/2017  . B12 deficiency anemia   . Back pain 06/19/2014   10/1 IMO update  . Chest pain of uncertain etiology 06/20/2019  . Chest pressure 01/03/2017  . Chronic migraine 05/23/2017  . Essential hypertension   . Generalized anxiety disorder   . Low back pain   . Lupus anticoagulant disorder (HCC)   . Migraine with aura, intractable, without  status migrainosus   . Mixed hyperlipidemia   . Numbness and tingling of left side of face 01/03/2017  . Other specified hypothyroidism   . Shortness of breath 01/03/2017  . Vitamin D deficiency     Past Surgical History:  Procedure Laterality Date  . CESAREAN SECTION     X4  . OVARIAN CYST REMOVAL    . TUBAL LIGATION      Current Medications: Current Meds  Medication Sig  . ALPRAZolam (XANAX) 1 MG tablet Take 1 mg by mouth 2 (two) times daily.  . Cholecalciferol 125 MCG (5000 UT) TABS Take 5,000 Units by mouth daily.   . cyanocobalamin 1000 MCG tablet Take 1,000 mcg by mouth daily.   . hydroxychloroquine (PLAQUENIL) 200 MG tablet Take 200 mg by mouth daily.  Marland Kitchen. losartan (COZAAR) 25 MG tablet Take 1 tablet (25 mg total) by mouth daily.  . Omega-3 Fatty Acids (FISH OIL) 1000 MG CAPS Take 1,000 mg by mouth daily.  . phentermine (ADIPEX-P) 37.5 MG tablet Take 37.5 mg by mouth daily.  . propranolol (INDERAL) 20 MG tablet Take 1 tablet (20 mg total) by mouth 3 (three) times daily.  Marland Kitchen. topiramate (TOPAMAX) 50 MG tablet Take 50 mg by mouth daily.  Marland Kitchen. vortioxetine HBr (TRINTELLIX) 20 MG TABS tablet Take 20 mg by mouth daily.     Allergies:   Morphine, Other, and Piperacillin-tazobactam in dex  Social History   Socioeconomic History  . Marital status: Unknown    Spouse name: Not on file  . Number of children: Not on file  . Years of education: Not on file  . Highest education level: Not on file  Occupational History  . Not on file  Tobacco Use  . Smoking status: Never Smoker  . Smokeless tobacco: Never Used  Substance and Sexual Activity  . Alcohol use: Never  . Drug use: Never  . Sexual activity: Not on file  Other Topics Concern  . Not on file  Social History Narrative  . Not on file   Social Determinants of Health   Financial Resource Strain:   . Difficulty of Paying Living Expenses:   Food Insecurity:   . Worried About Programme researcher, broadcasting/film/video in the Last Year:   . Occupational psychologist in the Last Year:   Transportation Needs:   . Freight forwarder (Medical):   Marland Kitchen Lack of Transportation (Non-Medical):   Physical Activity:   . Days of Exercise per Week:   . Minutes of Exercise per Session:   Stress:   . Feeling of Stress :   Social Connections:   . Frequency of Communication with Friends and Family:   . Frequency of Social Gatherings with Friends and Family:   . Attends Religious Services:   . Active Member of Clubs or Organizations:   . Attends Banker Meetings:   Marland Kitchen Marital Status:      Family History: The patient's family history includes Anxiety disorder in her mother; COPD in her mother; Kidney failure in her father.  ROS:   Review of Systems  Constitution: Negative for decreased appetite, fever and weight gain.  HENT: Negative for congestion, ear discharge, hoarse voice and sore throat.   Eyes: Negative for discharge, redness, vision loss in right eye and visual halos.  Cardiovascular: Negative for chest pain, dyspnea on exertion, leg swelling, orthopnea and palpitations.  Respiratory: Negative for cough, hemoptysis, shortness of breath and snoring.   Endocrine: Negative for heat intolerance and polyphagia.  Hematologic/Lymphatic: Negative for bleeding problem. Does not bruise/bleed easily.  Skin: Negative for flushing, nail changes, rash and suspicious lesions.  Musculoskeletal: Negative for arthritis, joint pain, muscle cramps, myalgias, neck pain and stiffness.  Gastrointestinal: Negative for abdominal pain, bowel incontinence, diarrhea and excessive appetite.  Genitourinary: Negative for decreased libido, genital sores and incomplete emptying.  Neurological: Negative for brief paralysis, focal weakness, headaches and loss of balance.  Psychiatric/Behavioral: Negative for altered mental status, depression and suicidal ideas.  Allergic/Immunologic: Negative for HIV exposure and persistent infections.    EKGs/Labs/Other  Studies Reviewed:    The following studies were reviewed today:   EKG: None today  Exercise stress test  Good exercise capacity, achieved 10.1 METS  Upsloping ST segment depression. No evidence of ischemia on EKG  Nuclear stress EF: 57%.  The left ventricular ejection fraction is normal (55-65%).  The study is normal.  This is a low risk study.    TTE IMPRESSIONS  1. 0.8 cm membranous VSD noted. Left ventricular ejection fraction, by  estimation, is 55 to 60%. The left ventricle has normal function. The left  ventricle has no regional wall motion abnormalities. Left ventricular  diastolic parameters were normal.  2. Right ventricular systolic function is normal. The right ventricular  size is normal. There is normal pulmonary artery systolic pressure.  3. The mitral valve is normal in structure. Trivial mitral valve  regurgitation. No evidence of mitral stenosis.  4. The aortic valve is normal in structure. Aortic valve regurgitation is  not visualized. No aortic stenosis is present.  5. The inferior vena cava is normal in size with greater than 50%  respiratory variability, suggesting right atrial pressure of 3 mmHg.   FINDINGS  Left Ventricle: 0.8 cm membranous VSD noted. Left ventricular ejection  fraction, by estimation, is 55 to 60%. The left ventricle has normal  function. The left ventricle has no regional wall motion abnormalities.  Definity contrast agent was given IV to  delineate the left ventricular endocardial borders. The left ventricular  internal cavity size was normal in size. There is no left ventricular  hypertrophy. Left ventricular diastolic parameters were normal.   Right Ventricle: The right ventricular size is normal. No increase in  right ventricular wall thickness. Right ventricular systolic function is  normal. There is normal pulmonary artery systolic pressure. The tricuspid  regurgitant velocity is 1.54 m/s, and  with an assumed  right atrial pressure of 3 mmHg, the estimated right  ventricular systolic pressure is 12.5 mmHg.   Left Atrium: Left atrial size was normal in size.   Right Atrium: Right atrial size was normal in size.   Pericardium: Trivial pericardial effusion is present.   Mitral Valve: The mitral valve is normal in structure. Normal mobility of  the mitral valve leaflets. Trivial mitral valve regurgitation. No evidence  of mitral valve stenosis.   Tricuspid Valve: The tricuspid valve is normal in structure. Tricuspid  valve regurgitation is trivial. No evidence of tricuspid stenosis.   Aortic Valve: The aortic valve is normal in structure.. There is mild  thickening and mild calcification of the aortic valve. Aortic valve  regurgitation is not visualized. No aortic stenosis is present. There is  mild thickening of the aortic valve. There  is mild calcification of the aortic valve.   Pulmonic Valve: The pulmonic valve was normal in structure. Pulmonic valve  regurgitation is not visualized. No evidence of pulmonic stenosis.   Aorta: The aortic root is normal in size and structure.   Venous: The inferior vena cava is normal in size with greater than 50%  respiratory variability, suggesting right atrial pressure of 3 mmHg.   IAS/Shunts: No atrial level shunt detected by color flow Doppler.     Recent Labs: 08/20/2019: Magnesium 1.9 09/14/2019: BUN 11; Creatinine, Ser 1.02; Hemoglobin 12.8; Platelets 374; Potassium 4.0; Sodium 137  Recent Lipid Panel No results found for: CHOL, TRIG, HDL, CHOLHDL, VLDL, LDLCALC, LDLDIRECT  Physical Exam:    VS:  BP 137/84 (BP Location: Right Arm, Patient Position: Sitting, Cuff Size: Normal)   Pulse (!) 106   Ht  (1.6 m)   Wt 153 lb 12.8 oz (69.8 kg)   SpO2 100%   BMI 27.24 kg/m     Wt Readings from Last 3 Encounters:  09/15/19 153 lb 12.8 oz (69.8 kg)  09/14/19 130 lb (59 kg)  08/29/19 153 lb (69.4 kg)     GEN: Well nourished, well  developed in no acute distress HEENT: Normal NECK: No JVD; No carotid bruits LYMPHATICS: No lymphadenopathy CARDIAC: S1S2 noted,RRR, no murmurs, rubs, gallops RESPIRATORY:  Clear to auscultation without rales, wheezing or rhonchi  ABDOMEN: Soft, non-tender, non-distended, +bowel sounds, no guarding. EXTREMITIES: bilateral +1 leg edema, No cyanosis, no clubbing MUSCULOSKELETAL:  No deformity  SKIN: Warm and dry NEUROLOGIC:  Alert and oriented x 3, non-focal PSYCHIATRIC:  Normal affect, good insight  ASSESSMENT:  1. VSD (ventricular septal defect)   2. Dyspnea on exertion   3. Mixed hyperlipidemia   4. Palpitations    PLAN:     VSD - she still have not been able to set her appointment with structural heart team.  I discussed with the patient that one of the barriers is the fact that she does not have a voicemail box which limits [place] leaving messages if we call her about appointments. Set this up for now.  In addition we will set her up for her testing which will include transesophageal echocardiogram as well as a right and left heart catheterization.  She still needs to follow-up with structural heart.  She does have some shortness of breath with bilateral leg edema, the leg edema is new.  Probably started patient on Lasix 20 mg daily with potassium supplements.  She still is experiencing some of the palpitation and had a presyncope episode at work.  Still pending.  MRI brain without any acute abnormalities.  Patient tells me that she is unable to go back to work given her symptoms and she is not able to perform her work duties.  Requested a work note as she tells me that she would not be able to perform these duties.  Given her work until her reevaluation 1 month from now.  The patient is in agreement with the above plan. The patient left the office in stable condition.  The patient will follow up in   Medication Adjustments/Labs and Tests Ordered: Current medicines are reviewed  at length with the patient today.  Concerns regarding medicines are outlined above.  Orders Placed This Encounter  Procedures  . Basic Metabolic Panel (BMET)  . CBC   Meds ordered this encounter  Medications  . furosemide (LASIX) 20 MG tablet    Sig: Take 1 tablet (20 mg total) by mouth daily.    Dispense:  90 tablet    Refill:  3  . potassium chloride SA (KLOR-CON) 20 MEQ tablet    Sig: Take 1 tablet (20 mEq total) by mouth daily.    Dispense:  90 tablet    Refill:  3    Patient Instructions  Medication Instructions:  Your physician has recommended you make the following change in your medication:  START: Furosemide 20 mg take one tablet by mouth daily.  START: Potassium 20 meq take one tablet by mouth daily.  *If you need a refill on your cardiac medications before your next appointment, please call your pharmacy*   Lab Work: Your physician recommends that you return for lab work on: 09/24/19 or 09/25/19 BMP, CBC If you have labs (blood work) drawn today and your tests are completely normal, you will receive your results only by: Marland Kitchen MyChart Message (if you have MyChart) OR . A paper copy in the mail If you have any lab test that is abnormal or we need to change your treatment, we will call you to review the results.   Testing/Procedures: You are scheduled for a TEE on 09/30/19 with Dr. Oval Linsey.  Please arrive at the [facility] (Main Entrance A) at [facility]: [address] [place], [place] [address] at 7:30 am.  DIET: Nothing to eat or drink after midnight except a sip of water with medications (see medication instructions below)  You must have a responsible person to drive you home and stay in the waiting area during your procedure. Failure to do so could result in cancellation.  Bring your insurance cards.  *  Special Note: Every effort is made to have your procedure done on time. Occasionally there are emergencies that occur at the hospital that may cause  delays. Please be patient if a delay does occur.      [facility] AT [place] [address] [place] [address] Dept: [phone] Loc: [phone]  Rosemond Lyttle  09/15/2019  You are scheduled for a Cardiac Catheterization on Wednesday, June 30 with Dr. Tonny Bollman.  1. Please arrive at the [facility] (Main Entrance A) at [facility]: [address] [place], [place] [address] at 8:30 AM (This time is two hours before your procedure to ensure your preparation). Free valet parking service is available.   Special note: Every effort is made to have your procedure done on time. Please understand that emergencies sometimes delay scheduled procedures.  2. Diet: Do not eat solid foods after midnight.  The patient may have clear liquids until 5am upon the day of the procedure.  4. Medication instructions in preparation for your procedure:   Contrast Allergy: No   Hold your Losartan on the morning of the procedure.   5. Plan for one night stay--bring personal belongings. 6. Bring a current list of your medications and current insurance cards. 7. You MUST have a responsible person to drive you home. 8. Someone MUST be with you the first 24 hours after you arrive home or your discharge will be delayed. 9. Please wear clothes that are easy to get on and off and wear slip-on shoes.  Thank you for allowing [place] to care for you!   -- [facility]       Follow-Up: At [facility], you and your health needs are our priority.  As part of our continuing mission to provide you with exceptional heart care, we have created designated Provider Care Teams.  These Care Teams include your primary Cardiologist (physician) and Advanced Practice Providers (APPs -  Physician Assistants and Nurse Practitioners) who all work together to provide you with the care you need, when you need  it.  We recommend signing up for the patient portal called "MyChart".  Sign up information is provided on this After Visit Summary.  MyChart is used to connect with patients for Virtual Visits (Telemedicine).  Patients are able to view lab/test results, encounter notes, upcoming appointments, etc.  Non-urgent messages can be sent to your provider as well.   To learn more about what you can do with MyChart, go to [email or web address].    Your next appointment:   1 month(s)  The format for your next appointment:   In Person  Provider:   Thomasene Ripple, DO   Other Instructions     Adopting a Healthy Lifestyle.  Know what a healthy weight is for you (roughly BMI <25) and aim to maintain this   Aim for 7+ servings of fruits and vegetables daily   65-80+ fluid ounces of water or unsweet tea for healthy kidneys   Limit to max 1 drink of alcohol per day; avoid smoking/tobacco   Limit animal fats in diet for cholesterol and heart health - choose grass fed whenever available   Avoid highly processed foods, and foods high in saturated/trans fats   Aim for low stress - take time to unwind and care for your mental health   Aim for 150 min of moderate intensity exercise weekly for heart health, and weights twice weekly for bone health  Aim for 7-9 hours of sleep daily   When it comes to diets, agreement about the perfect plan isnt easy to find, even among the experts. Experts at the [facility] [employer] developed an idea known as the Healthy Eating Plate. Just imagine a plate divided into logical, healthy portions.   The emphasis is on diet quality:   Load up on vegetables and fruits - one-half of your plate: Aim for color and variety, and remember that potatoes dont count.   Go for whole grains - one-quarter of your plate: Whole wheat, barley, wheat berries, quinoa, oats, brown rice, and foods made with them. If you want pasta, go with whole wheat pasta.   Protein  power - one-quarter of your plate: Fish, chicken, beans, and nuts are all healthy, versatile protein sources. Limit red meat.   The diet, however, does go beyond the plate, offering a few other suggestions.   Use healthy plant oils, such as olive, canola, soy, corn, sunflower and peanut. Check the labels, and avoid partially hydrogenated oil, which have unhealthy trans fats.   If youre thirsty, drink water. Coffee and tea are good in moderation, but skip sugary drinks and limit milk and dairy products to one or two daily servings.   The type of carbohydrate in the diet is more important than the amount. Some sources of carbohydrates, such as vegetables, fruits, whole grains, and beans-are healthier than others.   Finally, stay active  Signed, Thomasene Ripple, DO  09/15/2019 2:00 PM    [facility]

## 2019-09-15 NOTE — Telephone Encounter (Signed)
Tried calling patient. No answer and no voicemail set up for me to leave a message.  Dr. Servando Salina requested that I call the patient to request that she get an appointment set up with Dr. Excell Seltzer. Dr. Servando Salina would also like to see the patient in about 2-3 weeks.

## 2019-09-15 NOTE — ED Provider Notes (Signed)
Patient signed out to me by Dr. Pilar Plate to follow-up on MRI.  She had been seen with complaints of left-sided numbness.  MRI has been performed and does not show any acute abnormality.  Etiology is unclear.  Symptoms have apparently been ongoing for 2 months, will be appropriate for further outpatient follow-up.   Gilda Crease, MD 09/15/19 [phone]

## 2019-09-15 NOTE — Progress Notes (Signed)
MRI ordered changed to wo contrast only. Pt states she is allergic to MRI contrast. She had sob irregular HR and hives on last contrast enhanced MRI at [facility].

## 2019-09-15 NOTE — H&P (View-Only) (Signed)
Cardiology Office Note:    Date:  09/15/2019   ID:  Donna SchmidtKatherine Sutton, DOB 01/14/1976, MRN [id number]  PCP:  Galvin ProfferHague, Imran P, MD  Cardiologist:  Donna RippleKardie Sonoma Firkus, DO  Electrophysiologist:  None   Referring MD: Galvin ProfferHague, Imran P, MD   " I was short of breath"   History of Present Illness:    Donna Sutton is a 44 y.o. female with a hx of hypertension, membranous VSD, hyperlipidemia presents today for follow-up visit.  I saw the patient on 11/29/2019 at that time she reported that she has been experiencing palpitations.  At this moment patient is in visiting in addition her echo was reviewed which showed of membranous VSD.  Referred the patient to see structural heart team but she has not been able to do so.  She called multiple times office requesting to be seen for shortness of breath.  Fortunately we will unable to see her on Friday however asked the patient to go to the emergency department to be evaluated.  Patient tells me that she has been also experiencing shortness of breath left-sided numbness.  While she was in emergency department she did get an MRI which showed no abnormalities.  Is discharged home.  Patient tells me that the shortness of breath has persisted and at times she feels like she is having presyncope episodes and with climbing and working she is unable to perform her work duties.  She was very emotional and teary during this visit.    Past Medical History:  Diagnosis Date  . Abnormal laboratory test 01/17/2017   Formatting of this note might be different from the original. Abnormal ANA speckled pattern  . Atrophy of left kidney 10/15/2017  . B12 deficiency anemia   . Back pain 06/19/2014   10/1 IMO update  . Chest pain of uncertain etiology 06/20/2019  . Chest pressure 01/03/2017  . Chronic migraine 05/23/2017  . Essential hypertension   . Generalized anxiety disorder   . Low back pain   . Lupus anticoagulant disorder (HCC)   . Migraine with aura, intractable, without  status migrainosus   . Mixed hyperlipidemia   . Numbness and tingling of left side of face 01/03/2017  . Other specified hypothyroidism   . Shortness of breath 01/03/2017  . Vitamin D deficiency     Past Surgical History:  Procedure Laterality Date  . CESAREAN SECTION     X4  . OVARIAN CYST REMOVAL    . TUBAL LIGATION      Current Medications: Current Meds  Medication Sig  . ALPRAZolam (XANAX) 1 MG tablet Take 1 mg by mouth 2 (two) times daily.  . Cholecalciferol 125 MCG (5000 UT) TABS Take 5,000 Units by mouth daily.   . cyanocobalamin 1000 MCG tablet Take 1,000 mcg by mouth daily.   . hydroxychloroquine (PLAQUENIL) 200 MG tablet Take 200 mg by mouth daily.  Marland Kitchen. losartan (COZAAR) 25 MG tablet Take 1 tablet (25 mg total) by mouth daily.  . Omega-3 Fatty Acids (FISH OIL) 1000 MG CAPS Take 1,000 mg by mouth daily.  . phentermine (ADIPEX-P) 37.5 MG tablet Take 37.5 mg by mouth daily.  . propranolol (INDERAL) 20 MG tablet Take 1 tablet (20 mg total) by mouth 3 (three) times daily.  Marland Kitchen. topiramate (TOPAMAX) 50 MG tablet Take 50 mg by mouth daily.  Marland Kitchen. vortioxetine HBr (TRINTELLIX) 20 MG TABS tablet Take 20 mg by mouth daily.     Allergies:   Morphine, Other, and Piperacillin-tazobactam in dex  Social History   Socioeconomic History  . Marital status: Unknown    Spouse name: Not on file  . Number of children: Not on file  . Years of education: Not on file  . Highest education level: Not on file  Occupational History  . Not on file  Tobacco Use  . Smoking status: Never Smoker  . Smokeless tobacco: Never Used  Substance and Sexual Activity  . Alcohol use: Never  . Drug use: Never  . Sexual activity: Not on file  Other Topics Concern  . Not on file  Social History Narrative  . Not on file   Social Determinants of Health   Financial Resource Strain:   . Difficulty of Paying Living Expenses:   Food Insecurity:   . Worried About Programme researcher, broadcasting/film/video in the Last Year:   . Occupational psychologist in the Last Year:   Transportation Needs:   . Freight forwarder (Medical):   Marland Kitchen Lack of Transportation (Non-Medical):   Physical Activity:   . Days of Exercise per Week:   . Minutes of Exercise per Session:   Stress:   . Feeling of Stress :   Social Connections:   . Frequency of Communication with Friends and Family:   . Frequency of Social Gatherings with Friends and Family:   . Attends Religious Services:   . Active Member of Clubs or Organizations:   . Attends Banker Meetings:   Marland Kitchen Marital Status:      Family History: The patient's family history includes Anxiety disorder in her mother; COPD in her mother; Kidney failure in her father.  ROS:   Review of Systems  Constitution: Negative for decreased appetite, fever and weight gain.  HENT: Negative for congestion, ear discharge, hoarse voice and sore throat.   Eyes: Negative for discharge, redness, vision loss in right eye and visual halos.  Cardiovascular: Negative for chest pain, dyspnea on exertion, leg swelling, orthopnea and palpitations.  Respiratory: Negative for cough, hemoptysis, shortness of breath and snoring.   Endocrine: Negative for heat intolerance and polyphagia.  Hematologic/Lymphatic: Negative for bleeding problem. Does not bruise/bleed easily.  Skin: Negative for flushing, nail changes, rash and suspicious lesions.  Musculoskeletal: Negative for arthritis, joint pain, muscle cramps, myalgias, neck pain and stiffness.  Gastrointestinal: Negative for abdominal pain, bowel incontinence, diarrhea and excessive appetite.  Genitourinary: Negative for decreased libido, genital sores and incomplete emptying.  Neurological: Negative for brief paralysis, focal weakness, headaches and loss of balance.  Psychiatric/Behavioral: Negative for altered mental status, depression and suicidal ideas.  Allergic/Immunologic: Negative for HIV exposure and persistent infections.    EKGs/Labs/Other  Studies Reviewed:    The following studies were reviewed today:   EKG: None today  Exercise stress test  Good exercise capacity, achieved 10.1 METS  Upsloping ST segment depression. No evidence of ischemia on EKG  Nuclear stress EF: 57%.  The left ventricular ejection fraction is normal (55-65%).  The study is normal.  This is a low risk study.    TTE IMPRESSIONS  1. 0.8 cm membranous VSD noted. Left ventricular ejection fraction, by  estimation, is 55 to 60%. The left ventricle has normal function. The left  ventricle has no regional wall motion abnormalities. Left ventricular  diastolic parameters were normal.  2. Right ventricular systolic function is normal. The right ventricular  size is normal. There is normal pulmonary artery systolic pressure.  3. The mitral valve is normal in structure. Trivial mitral valve  regurgitation. No evidence of mitral stenosis.  4. The aortic valve is normal in structure. Aortic valve regurgitation is  not visualized. No aortic stenosis is present.  5. The inferior vena cava is normal in size with greater than 50%  respiratory variability, suggesting right atrial pressure of 3 mmHg.   FINDINGS  Left Ventricle: 0.8 cm membranous VSD noted. Left ventricular ejection  fraction, by estimation, is 55 to 60%. The left ventricle has normal  function. The left ventricle has no regional wall motion abnormalities.  Definity contrast agent was given IV to  delineate the left ventricular endocardial borders. The left ventricular  internal cavity size was normal in size. There is no left ventricular  hypertrophy. Left ventricular diastolic parameters were normal.   Right Ventricle: The right ventricular size is normal. No increase in  right ventricular wall thickness. Right ventricular systolic function is  normal. There is normal pulmonary artery systolic pressure. The tricuspid  regurgitant velocity is 1.54 m/s, and  with an assumed  right atrial pressure of 3 mmHg, the estimated right  ventricular systolic pressure is 12.5 mmHg.   Left Atrium: Left atrial size was normal in size.   Right Atrium: Right atrial size was normal in size.   Pericardium: Trivial pericardial effusion is present.   Mitral Valve: The mitral valve is normal in structure. Normal mobility of  the mitral valve leaflets. Trivial mitral valve regurgitation. No evidence  of mitral valve stenosis.   Tricuspid Valve: The tricuspid valve is normal in structure. Tricuspid  valve regurgitation is trivial. No evidence of tricuspid stenosis.   Aortic Valve: The aortic valve is normal in structure.. There is mild  thickening and mild calcification of the aortic valve. Aortic valve  regurgitation is not visualized. No aortic stenosis is present. There is  mild thickening of the aortic valve. There  is mild calcification of the aortic valve.   Pulmonic Valve: The pulmonic valve was normal in structure. Pulmonic valve  regurgitation is not visualized. No evidence of pulmonic stenosis.   Aorta: The aortic root is normal in size and structure.   Venous: The inferior vena cava is normal in size with greater than 50%  respiratory variability, suggesting right atrial pressure of 3 mmHg.   IAS/Shunts: No atrial level shunt detected by color flow Doppler.     Recent Labs: 08/20/2019: Magnesium 1.9 09/14/2019: BUN 11; Creatinine, Ser 1.02; Hemoglobin 12.8; Platelets 374; Potassium 4.0; Sodium 137  Recent Lipid Panel No results found for: CHOL, TRIG, HDL, CHOLHDL, VLDL, LDLCALC, LDLDIRECT  Physical Exam:    VS:  BP 137/84 (BP Location: Right Arm, Patient Position: Sitting, Cuff Size: Normal)   Pulse (!) 106   Ht 5' 3" (1.6 m)   Wt 153 lb 12.8 oz (69.8 kg)   SpO2 100%   BMI 27.24 kg/m     Wt Readings from Last 3 Encounters:  09/15/19 153 lb 12.8 oz (69.8 kg)  09/14/19 130 lb (59 kg)  08/29/19 153 lb (69.4 kg)     GEN: Well nourished, well  developed in no acute distress HEENT: Normal NECK: No JVD; No carotid bruits LYMPHATICS: No lymphadenopathy CARDIAC: S1S2 noted,RRR, no murmurs, rubs, gallops RESPIRATORY:  Clear to auscultation without rales, wheezing or rhonchi  ABDOMEN: Soft, non-tender, non-distended, +bowel sounds, no guarding. EXTREMITIES: bilateral +1 leg edema, No cyanosis, no clubbing MUSCULOSKELETAL:  No deformity  SKIN: Warm and dry NEUROLOGIC:  Alert and oriented x 3, non-focal PSYCHIATRIC:  Normal affect, good insight  ASSESSMENT:      1. VSD (ventricular septal defect)   2. Dyspnea on exertion   3. Mixed hyperlipidemia   4. Palpitations    PLAN:     VSD - she still have not been able to set her appointment with structural heart team.  I discussed with the patient that one of the barriers is the fact that she does not have a voicemail box which limits [place] leaving messages if we call her about appointments. Set this up for now.  In addition we will set her up for her testing which will include transesophageal echocardiogram as well as a right and left heart catheterization.  She still needs to follow-up with structural heart.  She does have some shortness of breath with bilateral leg edema, the leg edema is new.  Probably started patient on Lasix 20 mg daily with potassium supplements.  She still is experiencing some of the palpitation and had a presyncope episode at work.  Still pending.  MRI brain without any acute abnormalities.  Patient tells me that she is unable to go back to work given her symptoms and she is not able to perform her work duties.  Requested a work note as she tells me that she would not be able to perform these duties.  Given her work until her reevaluation 1 month from now.  The patient is in agreement with the above plan. The patient left the office in stable condition.  The patient will follow up in   Medication Adjustments/Labs and Tests Ordered: Current medicines are reviewed  at length with the patient today.  Concerns regarding medicines are outlined above.  Orders Placed This Encounter  Procedures  . Basic Metabolic Panel (BMET)  . CBC   Meds ordered this encounter  Medications  . furosemide (LASIX) 20 MG tablet    Sig: Take 1 tablet (20 mg total) by mouth daily.    Dispense:  90 tablet    Refill:  3  . potassium chloride SA (KLOR-CON) 20 MEQ tablet    Sig: Take 1 tablet (20 mEq total) by mouth daily.    Dispense:  90 tablet    Refill:  3    Patient Instructions  Medication Instructions:  Your physician has recommended you make the following change in your medication:  START: Furosemide 20 mg take one tablet by mouth daily.  START: Potassium 20 meq take one tablet by mouth daily.  *If you need a refill on your cardiac medications before your next appointment, please call your pharmacy*   Lab Work: Your physician recommends that you return for lab work on: 09/24/19 or 09/25/19 BMP, CBC If you have labs (blood work) drawn today and your tests are completely normal, you will receive your results only by: Marland Kitchen MyChart Message (if you have MyChart) OR . A paper copy in the mail If you have any lab test that is abnormal or we need to change your treatment, we will call you to review the results.   Testing/Procedures: You are scheduled for a TEE on 09/30/19 with Dr. Oval Linsey.  Please arrive at the [facility] (Main Entrance A) at [facility]: [address] [place], [place] [address] at 7:30 am.  DIET: Nothing to eat or drink after midnight except a sip of water with medications (see medication instructions below)  You must have a responsible person to drive you home and stay in the waiting area during your procedure. Failure to do so could result in cancellation.  Bring your insurance cards.  *  Special Note: Every effort is made to have your procedure done on time. Occasionally there are emergencies that occur at the hospital that may cause  delays. Please be patient if a delay does occur.      [facility] AT [place] [address] [place] [address] Dept: [phone] Loc: [phone]  Donna Sutton  09/15/2019  You are scheduled for a Cardiac Catheterization on Wednesday, June 30 with Dr. Tonny Bollman.  1. Please arrive at the [facility] (Main Entrance A) at [facility]: [address] [place], [place] [address] at 8:30 AM (This time is two hours before your procedure to ensure your preparation). Free valet parking service is available.   Special note: Every effort is made to have your procedure done on time. Please understand that emergencies sometimes delay scheduled procedures.  2. Diet: Do not eat solid foods after midnight.  The patient may have clear liquids until 5am upon the day of the procedure.  4. Medication instructions in preparation for your procedure:   Contrast Allergy: No   Hold your Losartan on the morning of the procedure.   5. Plan for one night stay--bring personal belongings. 6. Bring a current list of your medications and current insurance cards. 7. You MUST have a responsible person to drive you home. 8. Someone MUST be with you the first 24 hours after you arrive home or your discharge will be delayed. 9. Please wear clothes that are easy to get on and off and wear slip-on shoes.  Thank you for allowing [place] to care for you!   -- [facility]       Follow-Up: At [facility], you and your health needs are our priority.  As part of our continuing mission to provide you with exceptional heart care, we have created designated Provider Care Teams.  These Care Teams include your primary Cardiologist (physician) and Advanced Practice Providers (APPs -  Physician Assistants and Nurse Practitioners) who all work together to provide you with the care you need, when you need  it.  We recommend signing up for the patient portal called "MyChart".  Sign up information is provided on this After Visit Summary.  MyChart is used to connect with patients for Virtual Visits (Telemedicine).  Patients are able to view lab/test results, encounter notes, upcoming appointments, etc.  Non-urgent messages can be sent to your provider as well.   To learn more about what you can do with MyChart, go to [email or web address].    Your next appointment:   1 month(s)  The format for your next appointment:   In Person  Provider:   Thomasene Ripple, DO   Other Instructions     Adopting a Healthy Lifestyle.  Know what a healthy weight is for you (roughly BMI <25) and aim to maintain this   Aim for 7+ servings of fruits and vegetables daily   65-80+ fluid ounces of water or unsweet tea for healthy kidneys   Limit to max 1 drink of alcohol per day; avoid smoking/tobacco   Limit animal fats in diet for cholesterol and heart health - choose grass fed whenever available   Avoid highly processed foods, and foods high in saturated/trans fats   Aim for low stress - take time to unwind and care for your mental health   Aim for 150 min of moderate intensity exercise weekly for heart health, and weights twice weekly for bone health  Aim for 7-9 hours of sleep daily   When it comes to diets, agreement about the perfect plan isnt easy to find, even among the experts. Experts at the [facility] [employer] developed an idea known as the Healthy Eating Plate. Just imagine a plate divided into logical, healthy portions.   The emphasis is on diet quality:   Load up on vegetables and fruits - one-half of your plate: Aim for color and variety, and remember that potatoes dont count.   Go for whole grains - one-quarter of your plate: Whole wheat, barley, wheat berries, quinoa, oats, brown rice, and foods made with them. If you want pasta, go with whole wheat pasta.   Protein  power - one-quarter of your plate: Fish, chicken, beans, and nuts are all healthy, versatile protein sources. Limit red meat.   The diet, however, does go beyond the plate, offering a few other suggestions.   Use healthy plant oils, such as olive, canola, soy, corn, sunflower and peanut. Check the labels, and avoid partially hydrogenated oil, which have unhealthy trans fats.   If youre thirsty, drink water. Coffee and tea are good in moderation, but skip sugary drinks and limit milk and dairy products to one or two daily servings.   The type of carbohydrate in the diet is more important than the amount. Some sources of carbohydrates, such as vegetables, fruits, whole grains, and beans-are healthier than others.   Finally, stay active  Signed, Donna Ripple, DO  09/15/2019 2:00 PM    [facility]

## 2019-09-15 NOTE — Discharge Instructions (Signed)
Follow-up with Dr. Excell Seltzer for your chest pain.  Follow-up with neurology at [facility] as you have previously scheduled to have further work-up of the numbness.

## 2019-09-23 ENCOUNTER — Telehealth: Payer: Self-pay

## 2019-09-23 NOTE — Telephone Encounter (Signed)
Late entry from noon today:  Called patient to change time of cath 6/30. MyChart message with correct med instructions sent to patient.  The patient wanted Dr. Servando Salina to know that she just found out her cousin was born with lots of congenital defects and "all the organs are on the opposite side." She also states her daughter is pregnant and is having many problems. Her right heart is enlarged and she is seeing a cardiologist soon.  She wanted Dr. Servando Salina to know as FYI.

## 2019-09-23 NOTE — Telephone Encounter (Signed)
Thank you for the update. I will make sure Dr. Servando Salina is aware when she returns from vacation. Thank you for your assistance in scheduling her cardiac cath!  Alver Sorrow, NP

## 2019-09-25 ENCOUNTER — Telehealth: Payer: Self-pay | Admitting: Cardiology

## 2019-09-25 NOTE — Telephone Encounter (Signed)
Patient requesting to speak with Dr. Mallory Shirk nurse. She states that she would like to discuss some problems she is having. Would not disclose what it was about, just wanted a call back from the nurse.

## 2019-09-25 NOTE — Telephone Encounter (Signed)
Attempted to call patient no answer and voicemail box was full. Will continue efforts.

## 2019-09-25 NOTE — Telephone Encounter (Signed)
Okay to provide note that Dr. Servando Salina is out of office. Additional information for note provided below, as patient gave verbal consent for medical information to be included.   Thank you!  Alver Sorrow, NP  -----  Donna Sutton has been following with Dr. Servando Salina for cardiac care. She was seen in August for palpitations and recommended for an echocardiogram. That echocardiogram (an ultrasound of the heart) showed a membranous VSD or 'ventricular septal defect'. She is very symptomatic due to her VSD with shortness of breath, palpitations, presyncope. She has upcoming cardiac procedures including a TEE (transesophageal echocardiogram) on 09/30/19 and cardiac catheterization on 10/01/19 for workup of her VSD. She has been referred to our structural heart team for possible repair and has appointment upcoming. As VSD can cause significant valvular disease, it is imperative that her VSD be worked up and evaluated. This requires multiple visits to the office and for procedures. Please excuse her time away from work. She has an upcoming appointment with Dr. Servando Salina and her ability to return to work will be reassessed at that time.

## 2019-09-25 NOTE — Telephone Encounter (Signed)
Spoke with patient. She reports that starting yesterday and into today she has been experiencing some nausea and a headache. She also reports that her job is giving her a really hard time for being out of work. She is needing another note and more detailed this time so her work will accept it. She would like [place] to make a note that states her doctor, Dr. Servando Salina is out of the office and can give more information on her medical condition when she returns but something until then is needed for them to give her some more time. I have reached out to Milas Gain, NP who is helping to cover Dr. Mallory Shirk patients while she is out.  She has advised the patient to remember to drink fluids and do small frequent meals to help with headache and nausea. Patient was informed of this information and will let her know when we can have note ready. No further questions.

## 2019-09-26 NOTE — Telephone Encounter (Signed)
Attempted to call patient to let her know she can pick up letter today, no answer and mailbox was full.

## 2019-09-26 NOTE — Telephone Encounter (Signed)
Patient called back. Elenor Quinones let her know she can pick up letting in [place] this afternoon.

## 2019-09-26 NOTE — Telephone Encounter (Signed)
Pt arrived to the [facility] office for letter that had medical diagnosis and ongoing treatment as she requested. The letter was signed by Dr. Tomie China as Dr. Servando Salina is on vacation.

## 2019-09-27 ENCOUNTER — Other Ambulatory Visit (HOSPITAL_COMMUNITY)
Admission: RE | Admit: 2019-09-27 | Discharge: 2019-09-27 | Disposition: A | Discharge status: Home / Self Care | Payer: Self-pay | Source: Ambulatory Visit | Attending: Cardiovascular Disease | Admitting: Cardiovascular Disease

## 2019-09-27 DIAGNOSIS — Z01812 Encounter for preprocedural laboratory examination: Secondary | ICD-10-CM | POA: Insufficient documentation

## 2019-09-27 DIAGNOSIS — Z20822 Contact with and (suspected) exposure to covid-19: Secondary | ICD-10-CM | POA: Insufficient documentation

## 2019-09-27 LAB — BASIC METABOLIC PANEL
BUN/Creatinine Ratio: 9 (ref 9–23)
BUN: 11 mg/dL (ref 6–24)
CO2: 25 mmol/L (ref 20–29)
Calcium: 9.5 mg/dL (ref 8.7–10.2)
Chloride: 98 mmol/L (ref 96–106)
Creatinine, Ser: 1.22 mg/dL — ABNORMAL HIGH (ref 0.57–1.00)
GFR calc Af Amer: 63 mL/min/{1.73_m2} (ref >59)
GFR calc non Af Amer: 55 mL/min/{1.73_m2} — ABNORMAL LOW (ref >59)
Glucose: 106 mg/dL — ABNORMAL HIGH (ref 65–99)
Potassium: 4.1 mmol/L (ref 3.5–5.2)
Sodium: 136 mmol/L (ref 134–144)

## 2019-09-27 LAB — CBC
Hematocrit: 40.3 % (ref 34.0–46.6)
Hemoglobin: 13.2 g/dL (ref 11.1–15.9)
MCH: 28.3 pg (ref 26.6–33.0)
MCHC: 32.8 g/dL (ref 31.5–35.7)
MCV: 87 fL (ref 79–97)
Platelets: 391 10*3/uL (ref 150–450)
RBC: 4.66 x10E6/uL (ref 3.77–5.28)
RDW: 13.3 % (ref 11.7–15.4)
WBC: 9.1 10*3/uL (ref 3.4–10.8)

## 2019-09-27 LAB — SARS CORONAVIRUS 2 (TAT 6-24 HRS): SARS Coronavirus 2: NEGATIVE

## 2019-09-29 ENCOUNTER — Telehealth: Payer: Self-pay | Admitting: Cardiology

## 2019-09-29 ENCOUNTER — Telehealth: Payer: Self-pay

## 2019-09-29 NOTE — Telephone Encounter (Signed)
Patient called to return Hayley's call. Transferred patient to [facility].

## 2019-09-29 NOTE — Telephone Encounter (Signed)
-----   Message from Alver Sorrow, NP sent at 09/29/2019  7:44 AM EDT ----- CBC with no evidence of anemia or infection. Normal electrolytes. Kidney numbers show mild decline, recommend she remain adequately hydrated.

## 2019-09-29 NOTE — Telephone Encounter (Signed)
Spoke with patient regarding results and recommendation.  Patient verbalizes understanding and is agreeable to plan of care. Advised patient to call back with any issues or concerns.  

## 2019-09-29 NOTE — Telephone Encounter (Signed)
Attempted to call patient back, no answer and voicemail box full.  °

## 2019-09-29 NOTE — Telephone Encounter (Signed)
  Patient would like to speak to the nurse regarding her work note and issues she is having with work.

## 2019-09-29 NOTE — Telephone Encounter (Signed)
Please let the patient know that I am back on Thursday and I can have a new letter sent. Unfortunately I am not able to really speak with her employer as the official written documentation and my office note should serve as enough information. She may need to work with her employer. Based on her symptoms she requested to be out of work until she get her studies. If she thinks she is well to get back to work I will certainly be happy to clear her.

## 2019-09-29 NOTE — Telephone Encounter (Signed)
Patient called back. She states the nurse in [place] last week marked out "august" on her letter and corrected it to march however her job is trying to accuse her of doing this. She needs a new letter that states she was seen in march without it being marked out and needs the nurse to provide another letter stating she is the one who marked it out. She apologizes but her job is giving her a hard time and she is trying to do everything so they do not fire her. She is also requesting that when Dr. Servando Salina returns to ask if she would consider speaking with her job over the phone because even after the letter that Gillian Shields, NP made they are still questioning why she needs to be out of work aside from her two procedure appointments. Will consult with Ladonna Snide, RN and Dr. Servando Salina.

## 2019-09-29 NOTE — Telephone Encounter (Signed)
Spoke with the patient just now and she let me know that she was having difficulties with her work note that she received from [place] on Friday. She states that the note states that the patient was seen in August and it should say in March instead. She states that when she picked it up on Friday the August date was marked out by [facility] and March was written over it. She states that her place of employment has now stated that she did this and that it was not done by the nurse in our office. She is also asking if the note can be more detailed as her work is threatening to fire her. She needs to pick up an updated letter in [place] as soon as possible.   I am routing this to [facility] now as it seems like several notes have been written for this patient and this needs to be corrected.

## 2019-09-30 ENCOUNTER — Encounter (HOSPITAL_COMMUNITY): Admission: RE | Payer: Self-pay | Source: Home / Self Care

## 2019-09-30 ENCOUNTER — Encounter (HOSPITAL_COMMUNITY): Payer: Self-pay | Admitting: Cardiovascular Disease

## 2019-09-30 ENCOUNTER — Ambulatory Visit (HOSPITAL_COMMUNITY)
Admission: RE | Admit: 2019-09-30 | Payer: PRIVATE HEALTH INSURANCE | Source: Home / Self Care | Admitting: Cardiovascular Disease

## 2019-09-30 ENCOUNTER — Telehealth: Payer: Self-pay

## 2019-09-30 ENCOUNTER — Encounter (HOSPITAL_COMMUNITY): Payer: Self-pay | Admitting: Anesthesiology

## 2019-09-30 SURGERY — ECHOCARDIOGRAM, TRANSESOPHAGEAL
Anesthesia: Monitor Anesthesia Care

## 2019-09-30 NOTE — Progress Notes (Signed)
Attempted to contact pt by phone on 6/29 at 8:00am using numbers listed in chart for home and cell.  Unable to leave VM on pt cell - VM not set up at this time.  Pt scheduled for TEE on 09/30/19 at 8:30am with arrival at 7:30am.  Pt no show at this time.

## 2019-09-30 NOTE — Telephone Encounter (Signed)
Attempted to call the patient to go over her Pre Cath information..... she is scheduled for a TEE today.   Pt voicemail is full.   Need to ask pt about questionable contrast dye allergy noted in cath notes.

## 2019-09-30 NOTE — Addendum Note (Signed)
Addended byNorman Herrlich on: 09/30/2019 02:27 PM   Modules accepted: Orders, SmartSet

## 2019-09-30 NOTE — Telephone Encounter (Signed)
Called patient. Informed her Dr. Servando Salina will make new letter when she returns. She verbally understood no further questions.

## 2019-10-01 ENCOUNTER — Ambulatory Visit (HOSPITAL_COMMUNITY)
Admission: RE | Disposition: A | Discharge status: Home / Self Care | Payer: Self-pay | Source: Home / Self Care | Attending: Internal Medicine

## 2019-10-01 ENCOUNTER — Ambulatory Visit (HOSPITAL_BASED_OUTPATIENT_CLINIC_OR_DEPARTMENT_OTHER)
Admission: RE | Admit: 2019-10-01 | Discharge: 2019-10-01 | Disposition: A | Discharge status: Home / Self Care | Payer: Self-pay | Source: Ambulatory Visit | Attending: Cardiology | Admitting: Cardiology

## 2019-10-01 ENCOUNTER — Ambulatory Visit (HOSPITAL_COMMUNITY): Payer: Self-pay | Admitting: Anesthesiology

## 2019-10-01 ENCOUNTER — Ambulatory Visit (HOSPITAL_COMMUNITY)
Admission: RE | Admit: 2019-10-01 | Discharge: 2019-10-01 | Disposition: A | Discharge status: Home / Self Care | Payer: Self-pay | Attending: Internal Medicine | Admitting: Internal Medicine

## 2019-10-01 ENCOUNTER — Other Ambulatory Visit: Payer: Self-pay

## 2019-10-01 ENCOUNTER — Encounter (HOSPITAL_COMMUNITY): Payer: Self-pay | Admitting: Internal Medicine

## 2019-10-01 ENCOUNTER — Encounter (HOSPITAL_COMMUNITY)
Admission: RE | Disposition: A | Discharge status: Home / Self Care | Payer: Self-pay | Source: Home / Self Care | Attending: Internal Medicine

## 2019-10-01 DIAGNOSIS — Q210 Ventricular septal defect: Secondary | ICD-10-CM | POA: Insufficient documentation

## 2019-10-01 DIAGNOSIS — R0609 Other forms of dyspnea: Secondary | ICD-10-CM | POA: Insufficient documentation

## 2019-10-01 DIAGNOSIS — E782 Mixed hyperlipidemia: Secondary | ICD-10-CM | POA: Insufficient documentation

## 2019-10-01 DIAGNOSIS — R600 Localized edema: Secondary | ICD-10-CM | POA: Insufficient documentation

## 2019-10-01 DIAGNOSIS — I493 Ventricular premature depolarization: Secondary | ICD-10-CM | POA: Insufficient documentation

## 2019-10-01 DIAGNOSIS — Q211 Atrial septal defect: Secondary | ICD-10-CM

## 2019-10-01 DIAGNOSIS — I10 Essential (primary) hypertension: Secondary | ICD-10-CM | POA: Insufficient documentation

## 2019-10-01 DIAGNOSIS — Z880 Allergy status to penicillin: Secondary | ICD-10-CM | POA: Insufficient documentation

## 2019-10-01 DIAGNOSIS — N261 Atrophy of kidney (terminal): Secondary | ICD-10-CM | POA: Insufficient documentation

## 2019-10-01 DIAGNOSIS — D6862 Lupus anticoagulant syndrome: Secondary | ICD-10-CM | POA: Insufficient documentation

## 2019-10-01 DIAGNOSIS — Z885 Allergy status to narcotic agent status: Secondary | ICD-10-CM | POA: Insufficient documentation

## 2019-10-01 DIAGNOSIS — Z841 Family history of disorders of kidney and ureter: Secondary | ICD-10-CM | POA: Insufficient documentation

## 2019-10-01 DIAGNOSIS — F411 Generalized anxiety disorder: Secondary | ICD-10-CM | POA: Insufficient documentation

## 2019-10-01 DIAGNOSIS — R002 Palpitations: Secondary | ICD-10-CM | POA: Insufficient documentation

## 2019-10-01 DIAGNOSIS — G43909 Migraine, unspecified, not intractable, without status migrainosus: Secondary | ICD-10-CM | POA: Insufficient documentation

## 2019-10-01 DIAGNOSIS — Z79899 Other long term (current) drug therapy: Secondary | ICD-10-CM | POA: Insufficient documentation

## 2019-10-01 DIAGNOSIS — E038 Other specified hypothyroidism: Secondary | ICD-10-CM | POA: Insufficient documentation

## 2019-10-01 HISTORY — PX: RIGHT/LEFT HEART CATH AND CORONARY ANGIOGRAPHY: CATH118266

## 2019-10-01 HISTORY — PX: BUBBLE STUDY: SHX6837

## 2019-10-01 HISTORY — PX: TEE WITHOUT CARDIOVERSION: SHX5443

## 2019-10-01 LAB — POCT I-STAT EG7
Acid-base deficit: 3 mmol/L — ABNORMAL HIGH (ref 0.0–2.0)
Acid-base deficit: 4 mmol/L — ABNORMAL HIGH (ref 0.0–2.0)
Acid-base deficit: 4 mmol/L — ABNORMAL HIGH (ref 0.0–2.0)
Acid-base deficit: 4 mmol/L — ABNORMAL HIGH (ref 0.0–2.0)
Acid-base deficit: 4 mmol/L — ABNORMAL HIGH (ref 0.0–2.0)
Bicarbonate: 22.8 mmol/L (ref 20.0–28.0)
Bicarbonate: 22.8 mmol/L (ref 20.0–28.0)
Bicarbonate: 22.1 mmol/L (ref 20.0–28.0)
Bicarbonate: 22.3 mmol/L (ref 20.0–28.0)
Bicarbonate: 22.7 mmol/L (ref 20.0–28.0)
Calcium, Ion: 1.19 mmol/L (ref 1.15–1.40)
Calcium, Ion: 1.27 mmol/L (ref 1.15–1.40)
Calcium, Ion: 1.18 mmol/L (ref 1.15–1.40)
Calcium, Ion: 1.21 mmol/L (ref 1.15–1.40)
Calcium, Ion: 1.23 mmol/L (ref 1.15–1.40)
HCT: 35 % — ABNORMAL LOW (ref 36.0–46.0)
HCT: 36 % (ref 36.0–46.0)
HCT: 35 % — ABNORMAL LOW (ref 36.0–46.0)
HCT: 35 % — ABNORMAL LOW (ref 36.0–46.0)
HCT: 36 % (ref 36.0–46.0)
Hemoglobin: 11.9 g/dL — ABNORMAL LOW (ref 12.0–15.0)
Hemoglobin: 12.2 g/dL (ref 12.0–15.0)
Hemoglobin: 11.9 g/dL — ABNORMAL LOW (ref 12.0–15.0)
Hemoglobin: 11.9 g/dL — ABNORMAL LOW (ref 12.0–15.0)
Hemoglobin: 12.2 g/dL (ref 12.0–15.0)
O2 Saturation: 74 %
O2 Saturation: 79 %
O2 Saturation: 75 %
O2 Saturation: 76 %
O2 Saturation: 83 %
Potassium: 3.4 mmol/L — ABNORMAL LOW (ref 3.5–5.1)
Potassium: 3.6 mmol/L (ref 3.5–5.1)
Potassium: 3.4 mmol/L — ABNORMAL LOW (ref 3.5–5.1)
Potassium: 3.7 mmol/L (ref 3.5–5.1)
Potassium: 3.7 mmol/L (ref 3.5–5.1)
Sodium: 144 mmol/L (ref 135–145)
Sodium: 145 mmol/L (ref 135–145)
Sodium: 142 mmol/L (ref 135–145)
Sodium: 142 mmol/L (ref 135–145)
Sodium: 142 mmol/L (ref 135–145)
TCO2: 24 mmol/L (ref 22–32)
TCO2: 24 mmol/L (ref 22–32)
TCO2: 23 mmol/L (ref 22–32)
TCO2: 24 mmol/L (ref 22–32)
TCO2: 24 mmol/L (ref 22–32)
pCO2, Ven: 44.4 mmHg (ref 44.0–60.0)
pCO2, Ven: 46.4 mmHg (ref 44.0–60.0)
pCO2, Ven: 42.5 mmHg — ABNORMAL LOW (ref 44.0–60.0)
pCO2, Ven: 44.3 mmHg (ref 44.0–60.0)
pCO2, Ven: 45.5 mmHg (ref 44.0–60.0)
pH, Ven: 7.299 (ref 7.250–7.430)
pH, Ven: 7.32 (ref 7.250–7.430)
pH, Ven: 7.305 (ref 7.250–7.430)
pH, Ven: 7.306 (ref 7.250–7.430)
pH, Ven: 7.328 (ref 7.250–7.430)
pO2, Ven: 43 mmHg (ref 32.0–45.0)
pO2, Ven: 47 mmHg — ABNORMAL HIGH (ref 32.0–45.0)
pO2, Ven: 44 mmHg (ref 32.0–45.0)
pO2, Ven: 44 mmHg (ref 32.0–45.0)
pO2, Ven: 52 mmHg — ABNORMAL HIGH (ref 32.0–45.0)

## 2019-10-01 LAB — POCT I-STAT 7, (LYTES, BLD GAS, ICA,H+H)
Acid-base deficit: 4 mmol/L — ABNORMAL HIGH (ref 0.0–2.0)
Bicarbonate: 21.6 mmol/L (ref 20.0–28.0)
Calcium, Ion: 1.24 mmol/L (ref 1.15–1.40)
HCT: 36 % (ref 36.0–46.0)
Hemoglobin: 12.2 g/dL (ref 12.0–15.0)
O2 Saturation: 97 %
Potassium: 3.8 mmol/L (ref 3.5–5.1)
Sodium: 141 mmol/L (ref 135–145)
TCO2: 23 mmol/L (ref 22–32)
pCO2 arterial: 39.2 mmHg (ref 32.0–48.0)
pH, Arterial: 7.349 — ABNORMAL LOW (ref 7.350–7.450)
pO2, Arterial: 90 mmHg (ref 83.0–108.0)

## 2019-10-01 SURGERY — RIGHT/LEFT HEART CATH AND CORONARY ANGIOGRAPHY
Anesthesia: LOCAL

## 2019-10-01 SURGERY — ECHOCARDIOGRAM, TRANSESOPHAGEAL
Anesthesia: Monitor Anesthesia Care

## 2019-10-01 MED ORDER — LIDOCAINE 2% (20 MG/ML) 5 ML SYRINGE
INTRAMUSCULAR | Status: DC | PRN
  Administered 2019-10-01: 20 mg via INTRAVENOUS

## 2019-10-01 MED ORDER — PROPOFOL 500 MG/50ML IV EMUL
INTRAVENOUS | Status: DC | PRN
  Administered 2019-10-01: 150 ug/kg/min via INTRAVENOUS
  Administered 2019-10-01: 100 ug/kg/min via INTRAVENOUS

## 2019-10-01 MED ORDER — SODIUM CHLORIDE 0.9 % IV SOLN: 250.0000 mL | INTRAVENOUS | Status: DC | PRN

## 2019-10-01 MED ORDER — HYDRALAZINE HCL 20 MG/ML IJ SOLN: 10.0000 mg | INTRAMUSCULAR | Status: DC | PRN

## 2019-10-01 MED ORDER — ACETAMINOPHEN 325 MG PO TABS: 650.0000 mg | ORAL_TABLET | ORAL | Status: DC | PRN

## 2019-10-01 MED ORDER — HEPARIN (PORCINE) IN NACL 1000-0.9 UT/500ML-% IV SOLN
INTRAVENOUS | Status: DC | PRN
  Administered 2019-10-01 (×2): 500 mL

## 2019-10-01 MED ORDER — SODIUM CHLORIDE 0.9% FLUSH: 3.0000 mL | Freq: Two times a day (BID) | INTRAVENOUS | Status: DC

## 2019-10-01 MED ORDER — LIDOCAINE HCL (PF) 1 % IJ SOLN
INTRAMUSCULAR | Status: DC | PRN
  Administered 2019-10-01: 15 mL via INTRADERMAL
  Administered 2019-10-01: 2 mL via INTRADERMAL

## 2019-10-01 MED ORDER — SODIUM CHLORIDE 0.9 % WEIGHT BASED INFUSION: 1.0000 mL/kg/h | INTRAVENOUS | Status: DC

## 2019-10-01 MED ORDER — HEPARIN (PORCINE) IN NACL 1000-0.9 UT/500ML-% IV SOLN
INTRAVENOUS | Status: AC
  Filled 2019-10-01: qty 1000

## 2019-10-01 MED ORDER — METHYLPREDNISOLONE SODIUM SUCC 125 MG IJ SOLR
125.0000 mg | Freq: Once | INTRAMUSCULAR | Status: AC
  Administered 2019-10-01: 125 mg via INTRAVENOUS

## 2019-10-01 MED ORDER — MIDAZOLAM HCL 2 MG/2ML IJ SOLN
INTRAMUSCULAR | Status: DC | PRN
  Administered 2019-10-01: 1 mg via INTRAVENOUS

## 2019-10-01 MED ORDER — SODIUM CHLORIDE 0.9 % WEIGHT BASED INFUSION
3.0000 mL/kg/h | INTRAVENOUS | Status: AC
  Administered 2019-10-01: 3 mL/kg/h via INTRAVENOUS

## 2019-10-01 MED ORDER — ASPIRIN 81 MG PO CHEW: 81.0000 mg | CHEWABLE_TABLET | ORAL | Status: DC

## 2019-10-01 MED ORDER — VERAPAMIL HCL 2.5 MG/ML IV SOLN
INTRAVENOUS | Status: AC
  Filled 2019-10-01: qty 2

## 2019-10-01 MED ORDER — SODIUM CHLORIDE 0.9 % IV SOLN: INTRAVENOUS | Status: DC

## 2019-10-01 MED ORDER — DIPHENHYDRAMINE HCL 50 MG/ML IJ SOLN
25.0000 mg | Freq: Once | INTRAMUSCULAR | Status: AC
  Administered 2019-10-01: 25 mg via INTRAVENOUS

## 2019-10-01 MED ORDER — DIPHENHYDRAMINE HCL 50 MG/ML IJ SOLN
INTRAMUSCULAR | Status: AC
  Filled 2019-10-01: qty 1

## 2019-10-01 MED ORDER — MIDAZOLAM HCL 2 MG/2ML IJ SOLN
INTRAMUSCULAR | Status: AC
  Filled 2019-10-01: qty 2

## 2019-10-01 MED ORDER — HEPARIN SODIUM (PORCINE) 1000 UNIT/ML IJ SOLN
INTRAMUSCULAR | Status: AC
  Filled 2019-10-01: qty 1

## 2019-10-01 MED ORDER — LIDOCAINE HCL (PF) 1 % IJ SOLN
INTRAMUSCULAR | Status: AC
  Filled 2019-10-01: qty 30

## 2019-10-01 MED ORDER — SODIUM CHLORIDE 0.9% FLUSH: 3.0000 mL | INTRAVENOUS | Status: DC | PRN

## 2019-10-01 MED ORDER — ONDANSETRON HCL 4 MG/2ML IJ SOLN: 4.0000 mg | Freq: Four times a day (QID) | INTRAMUSCULAR | Status: DC | PRN

## 2019-10-01 MED ORDER — LABETALOL HCL 5 MG/ML IV SOLN: 10.0000 mg | INTRAVENOUS | Status: DC | PRN

## 2019-10-01 MED ORDER — METHYLPREDNISOLONE SODIUM SUCC 125 MG IJ SOLR
INTRAMUSCULAR | Status: AC
  Filled 2019-10-01: qty 2

## 2019-10-01 MED ORDER — VERAPAMIL HCL 2.5 MG/ML IV SOLN: INTRAVENOUS | Status: DC | PRN

## 2019-10-01 MED ORDER — IOHEXOL 350 MG/ML SOLN
INTRAVENOUS | Status: DC | PRN
  Administered 2019-10-01: 70 mL via INTRA_ARTERIAL

## 2019-10-01 MED ORDER — BUTAMBEN-TETRACAINE-BENZOCAINE 2-2-14 % EX AERO
INHALATION_SPRAY | CUTANEOUS | Status: DC | PRN
  Administered 2019-10-01: 2 via TOPICAL

## 2019-10-01 SURGICAL SUPPLY — 18 items
CATH BALLN WEDGE 5F 110CM (CATHETERS) ×2 IMPLANT
CATH INFINITI 5 FR 3DRC (CATHETERS) ×2 IMPLANT
CATH INFINITI 5FR MULTPACK ANG (CATHETERS) ×2 IMPLANT
CLOSURE MYNX CONTROL 5F (Vascular Products) ×2 IMPLANT
GLIDESHEATH SLEND SS 6F .021 (SHEATH) ×2 IMPLANT
GUIDEWIRE INQWIRE 1.5J.035X260 (WIRE) ×1 IMPLANT
INQWIRE 1.5J .035X260CM (WIRE) ×2
KIT HEART LEFT (KITS) ×2 IMPLANT
KIT MICROPUNCTURE NIT STIFF (SHEATH) ×2 IMPLANT
PACK CARDIAC CATHETERIZATION (CUSTOM PROCEDURE TRAY) ×2 IMPLANT
SHEATH GLIDE SLENDER 4/5FR (SHEATH) ×2 IMPLANT
SHEATH PINNACLE 5F 10CM (SHEATH) ×2 IMPLANT
SHEATH PROBE COVER 6X72 (BAG) ×2 IMPLANT
SYR MEDRAD MARK 7 150ML (SYRINGE) ×2 IMPLANT
TRANSDUCER W/STOPCOCK (MISCELLANEOUS) ×2 IMPLANT
TUBING CIL FLEX 10 FLL-RA (TUBING) ×2 IMPLANT
WIRE EMERALD 3MM-J .025X260CM (WIRE) ×2 IMPLANT
WIRE EMERALD 3MM-J .035X150CM (WIRE) ×2 IMPLANT

## 2019-10-01 NOTE — Progress Notes (Signed)
Ambulated in hallway and to the bathroom to void tol well. No bleeding noted before or after ambulation.  

## 2019-10-01 NOTE — Anesthesia Procedure Notes (Signed)
Procedure Name: MAC Date/Time: 10/01/2019 12:59 PM Performed by: Kyung Rudd, CRNA Pre-anesthesia Checklist: Patient identified, Emergency Drugs available, Suction available, Patient being monitored and Timeout performed Patient Re-evaluated:Patient Re-evaluated prior to induction Oxygen Delivery Method: Nasal cannula Induction Type: IV induction Placement Confirmation: positive ETCO2 Dental Injury: Teeth and Oropharynx as per pre-operative assessment

## 2019-10-01 NOTE — Progress Notes (Signed)
Discharge instructions reviewed with pt and her husband (via telephone) both voice understanding.  

## 2019-10-01 NOTE — Anesthesia Postprocedure Evaluation (Signed)
Anesthesia Post Note  Patient: Donna Sutton  Procedure(s) Performed: TRANSESOPHAGEAL ECHOCARDIOGRAM (TEE) (N/A ) BUBBLE STUDY     Patient location during evaluation: Endoscopy Anesthesia Type: MAC Level of consciousness: awake and alert, oriented and patient cooperative Pain management: pain level controlled Vital Signs Assessment: post-procedure vital signs reviewed and stable Respiratory status: spontaneous breathing, nonlabored ventilation and respiratory function stable Cardiovascular status: blood pressure returned to baseline and stable Postop Assessment: no apparent nausea or vomiting Anesthetic complications: no   No complications documented.  Last Vitals:  Vitals:   10/01/19 1406 10/01/19 1416  BP: 131/72 (!) 155/86  Pulse: 73 66  Resp: 13 18  Temp: 36.7 C   SpO2: 100% 100%    Last Pain:  Vitals:   10/01/19 1406  TempSrc: Temporal  PainSc: 0-No pain                 Shiven Junious,E. Ameyah Bangura

## 2019-10-01 NOTE — Interval H&P Note (Signed)
History and Physical Interval Note:  10/01/2019 12:26 PM  Donna Sutton  has presented today for surgery, with the diagnosis of VSD.  The various methods of treatment have been discussed with the patient and family. After consideration of risks, benefits and other options for treatment, the patient has consented to  Procedure(s): TRANSESOPHAGEAL ECHOCARDIOGRAM (TEE) (N/A) as a surgical intervention.  The patient's history has been reviewed, patient examined, no change in status, stable for surgery.  I have reviewed the patient's chart and labs.  Questions were answered to the patient's satisfaction.     Parke Poisson

## 2019-10-01 NOTE — Discharge Instructions (Signed)
This sheet gives you information about how to care for yourself after your procedure. Your health care provider may also give you more specific instructions. If you have problems or questions, contact your health care provider. What can I expect after the procedure? After the procedure, it is common to have:  Bruising and tenderness at the catheter insertion area. Follow these instructions at home: Medicines  Take over-the-counter and prescription medicines only as told by your health care provider. Insertion site care  Follow instructions from your health care provider about how to take care of your insertion site. Make sure you: ? Wash your hands with soap and water before you change your bandage (dressing). If soap and water are not available, use hand sanitizer. ? Change your dressing as told by your health care provider. ? Leave stitches (sutures), skin glue, or adhesive strips in place. These skin closures may need to stay in place for 2 weeks or longer. If adhesive strip edges start to loosen and curl up, you may trim the loose edges. Do not remove adhesive strips completely unless your health care provider tells you to do that.  Check your insertion site every day for signs of infection. Check for: ? Redness, swelling, or pain. ? Fluid or blood. ? Pus or a bad smell. ? Warmth.  Do not take baths, swim, or use a hot tub until your health care provider approves.  You may shower 24-48 hours after the procedure, or as directed by your health care provider. ? Remove the dressing and gently wash the site with plain soap and water. ? Pat the area dry with a clean towel. ? Do not rub the site. That could cause bleeding.  Do not apply powder or lotion to the site. Activity   For 24 hours after the procedure, or as directed by your health care provider: ? Do not flex or bend the affected arm. ? Do not push or pull heavy objects with the affected arm. ? Do not drive yourself home  from the hospital or clinic. You may drive 24 hours after the procedure unless your health care provider tells you not to. ? Do not operate machinery or power tools.  Do not lift anything that is heavier than 10 lb (4.5 kg), or the limit that you are told, until your health care provider says that it is safe.  Ask your health care provider when it is okay to: ? Return to work or school. ? Resume usual physical activities or sports. ? Resume sexual activity. General instructions  If the catheter site starts to bleed, raise your arm and put firm pressure on the site. If the bleeding does not stop, get help right away. This is a medical emergency.  If you went home on the same day as your procedure, a responsible adult should be with you for the first 24 hours after you arrive home.  Keep all follow-up visits as told by your health care provider. This is important. Contact a health care provider if:  You have a fever.  You have redness, swelling, or yellow drainage around your insertion site. Get help right away if:  You have unusual pain at the radial site.  The catheter insertion area swells very fast.  The insertion area is bleeding, and the bleeding does not stop when you hold steady pressure on the area.  Your arm or hand becomes pale, cool, tingly, or numb. These symptoms may represent a serious problem that is an emergency.  Do not wait to see if the symptoms will go away. Get medical help right away. Call your local emergency services (911 in the [place] Do not drive yourself to the hospital. Summary  After the procedure, it is common to have bruising and tenderness at the site.  Follow instructions from your health care provider about how to take care of your radial site wound. Check the wound every day for signs of infection.  Do not lift anything that is heavier than 10 lb (4.5 kg), or the limit that you are told, until your health care provider says that it is safe. This  information is not intended to replace advice given to you by your health care provider. Make sure you discuss any questions you have with your health care provider. Document Revised: 04/25/2017 Document Reviewed: 04/25/2017 Elsevier Patient Education  2020 [employer] Femoral Site Care This sheet gives you information about how to care for yourself after your procedure. Your health care provider may also give you more specific instructions. If you have problems or questions, contact your health care provider. What can I expect after the procedure? After the procedure, it is common to have:  Bruising that usually fades within 1-2 weeks.  Tenderness at the site. Follow these instructions at home: Wound care  Follow instructions from your health care provider about how to take care of your insertion site. Make sure you: ? Wash your hands with soap and water before you change your bandage (dressing). If soap and water are not available, use hand sanitizer. ? Change your dressing as told by your health care provider. ? Leave stitches (sutures), skin glue, or adhesive strips in place. These skin closures may need to stay in place for 2 weeks or longer. If adhesive strip edges start to loosen and curl up, you may trim the loose edges. Do not remove adhesive strips completely unless your health care provider tells you to do that.  Do not take baths, swim, or use a hot tub until your health care provider approves.  You may shower 24-48 hours after the procedure or as told by your health care provider. ? Gently wash the site with plain soap and water. ? Pat the area dry with a clean towel. ? Do not rub the site. This may cause bleeding.  Do not apply powder or lotion to the site. Keep the site clean and dry.  Check your femoral site every day for signs of infection. Check for: ? Redness, swelling, or pain. ? Fluid or blood. ? Warmth. ? Pus or a bad smell. Activity  For the first 2-3 days  after your procedure, or as long as directed: ? Avoid climbing stairs as much as possible. ? Do not squat.  Do not lift anything that is heavier than 10 lb (4.5 kg), or the limit that you are told, until your health care provider says that it is safe.  Rest as directed. ? Avoid sitting for a long time without moving. Get up to take short walks every 1-2 hours.  Do not drive for 24 hours if you were given a medicine to help you relax (sedative). General instructions  Take over-the-counter and prescription medicines only as told by your health care provider.  Keep all follow-up visits as told by your health care provider. This is important. Contact a health care provider if you have:  A fever or chills.  You have redness, swelling, or pain around your insertion site. Get help right away  if:  The catheter insertion area swells very fast.  You pass out.  You suddenly start to sweat or your skin gets clammy.  The catheter insertion area is bleeding, and the bleeding does not stop when you hold steady pressure on the area.  The area near or just beyond the catheter insertion site becomes pale, cool, tingly, or numb. These symptoms may represent a serious problem that is an emergency. Do not wait to see if the symptoms will go away. Get medical help right away. Call your local emergency services (911 in the [place] Do not drive yourself to the hospital. Summary  After the procedure, it is common to have bruising that usually fades within 1-2 weeks.  Check your femoral site every day for signs of infection.  Do not lift anything that is heavier than 10 lb (4.5 kg), or the limit that you are told, until your health care provider says that it is safe. This information is not intended to replace advice given to you by your health care provider. Make sure you discuss any questions you have with your health care provider. Document Revised: 04/02/2017 Document Reviewed: 04/02/2017 Elsevier  Patient Education  2020 [employer].

## 2019-10-01 NOTE — Interval H&P Note (Signed)
History and Physical Interval Note:  10/01/2019 2:59 PM  Donna Sutton  has presented today for surgery, with the diagnosis of VSD.  The various methods of treatment have been discussed with the patient and family. After consideration of risks, benefits and other options for treatment, the patient has consented to  Procedure(s): RIGHT/LEFT HEART CATH AND CORONARY ANGIOGRAPHY (N/A) as a surgical intervention.  The patient's history has been reviewed, patient examined, no change in status, stable for surgery.  I have reviewed the patient's chart and labs.  Questions were answered to the patient's satisfaction.     Tonny Bollman

## 2019-10-01 NOTE — Telephone Encounter (Signed)
Attempted to call the pt re: her Heart Cath scheduled for today... she did not show for her TEE 09/30/19. Unable to reach her.   Mailbox full on her home number and no voicemail on her cell.

## 2019-10-01 NOTE — Progress Notes (Signed)
  Echocardiogram Echocardiogram Transesophageal has been performed.  Donna Sutton 10/01/2019, 2:12 PM

## 2019-10-01 NOTE — CV Procedure (Signed)
INDICATIONS: VSD  PROCEDURE:   Informed consent was obtained prior to the procedure. The risks, benefits and alternatives for the procedure were discussed and the patient comprehended these risks.  Risks include, but are not limited to, cough, sore throat, vomiting, nausea, somnolence, esophageal and stomach trauma or perforation, bleeding, low blood pressure, aspiration, pneumonia, infection, trauma to the teeth and death.    After a procedural time-out, the oropharynx was anesthetized with 20% benzocaine spray.   During this procedure the patient was administered propofol per anesthesia.  The patient's heart rate, blood pressure, and oxygen saturationweare monitored continuously during the procedure. The period of conscious sedation was 40 minutes, of which I was present face-to-face 100% of this time.  The transesophageal probe was inserted in the esophagus and stomach without difficulty and multiple views were obtained.  The patient was kept under observation until the patient left the procedure room.  The patient left the procedure room in stable condition.   Agitated microbubble saline contrast was administered.  COMPLICATIONS:    There were no immediate complications.  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 Normal biventricular size and function.  No ASD, no sinus venosus defect. No definite anomalous pulmonary venous connections.  RECOMMENDATIONS:     proceed to r/lhc when alert.  Time Spent Directly with the Patient:  60 minutes   Parke Poisson 10/01/2019, 2:03 PM

## 2019-10-01 NOTE — Anesthesia Preprocedure Evaluation (Addendum)
Anesthesia Evaluation  Patient identified by MRN, date of birth, ID band Patient awake    Reviewed: Allergy & Precautions, NPO status , Patient's Chart, lab work & pertinent test results, reviewed documented beta blocker date and time   History of Anesthesia Complications Negative for: history of anesthetic complications  Airway Mallampati: II  TM Distance: >3 FB Neck ROM: Full    Dental  (+) Dental Advisory Given   Pulmonary shortness of breath,  09/27/2019 SARS coronavirus NEG   breath sounds clear to auscultation       Cardiovascular hypertension, Pt. on medications and Pt. 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 Left ventricular ejection fraction, by  estimation, is 55 to 60%   Neuro/Psych  Headaches,    GI/Hepatic negative GI ROS, Neg liver ROS,   Endo/Other  Hypothyroidism lupus  Renal/GU Renal InsufficiencyRenal disease (creat 1.22)     Musculoskeletal   Abdominal   Peds  Hematology negative hematology ROS (+)   Anesthesia Other Findings   Reproductive/Obstetrics                             Anesthesia Physical Anesthesia Plan  ASA: II  Anesthesia Plan: MAC   Post-op Pain Management:    Induction:   PONV Risk Score and Plan: 2 and Treatment may vary due to age or medical condition  Airway Management Planned: Natural Airway and Nasal Cannula  Additional Equipment:   Intra-op Plan:   Post-operative Plan:   Informed Consent: I have reviewed the patients History and Physical, chart, labs and discussed the procedure including the risks, benefits and alternatives for the proposed anesthesia with the patient or authorized representative who has indicated his/her understanding and acceptance.     Dental advisory given  Plan Discussed with: CRNA and Surgeon  Anesthesia Plan Comments:          Anesthesia Quick Evaluation

## 2019-10-01 NOTE — Transfer of Care (Signed)
Immediate Anesthesia Transfer of Care Note  Patient: Donna Sutton  Procedure(s) Performed: TRANSESOPHAGEAL ECHOCARDIOGRAM (TEE) (N/A ) BUBBLE STUDY  Patient Location: PACU and Endoscopy Unit  Anesthesia Type:MAC  Level of Consciousness: awake and patient cooperative  Airway & Oxygen Therapy: Patient Spontanous Breathing  Post-op Assessment: Report given to RN and Post -op Vital signs reviewed and stable  Post vital signs: Reviewed and stable  Last Vitals:  Vitals Value Taken Time  BP 131/72 10/01/19 1406  Temp    Pulse 70 10/01/19 1406  Resp 13 10/01/19 1406  SpO2 100 % 10/01/19 1406  Vitals shown include unvalidated device data.  Last Pain:  Vitals:   10/01/19 1406  PainSc: 0-No pain         Complications: No complications documented.

## 2019-10-02 ENCOUNTER — Ambulatory Visit (INDEPENDENT_AMBULATORY_CARE_PROVIDER_SITE_OTHER): Payer: Self-pay | Admitting: Cardiology

## 2019-10-02 ENCOUNTER — Encounter (HOSPITAL_COMMUNITY): Payer: Self-pay | Admitting: Cardiovascular Disease

## 2019-10-02 VITALS — BP 152/100 | HR 88 | Ht 63.0 in | Wt 154.0 lb

## 2019-10-02 DIAGNOSIS — Q348 Other specified congenital malformations of respiratory system: Secondary | ICD-10-CM | POA: Insufficient documentation

## 2019-10-02 DIAGNOSIS — I10 Essential (primary) hypertension: Secondary | ICD-10-CM

## 2019-10-02 DIAGNOSIS — I471 Supraventricular tachycardia: Secondary | ICD-10-CM

## 2019-10-02 DIAGNOSIS — R0602 Shortness of breath: Secondary | ICD-10-CM | POA: Insufficient documentation

## 2019-10-02 DIAGNOSIS — I729 Aneurysm of unspecified site: Secondary | ICD-10-CM

## 2019-10-02 MED ORDER — DILTIAZEM HCL ER COATED BEADS 120 MG PO CP24
120.0000 mg | ORAL_CAPSULE | Freq: Every day | ORAL | 3 refills | Status: DC
Start: 2019-10-02 — End: 2019-11-10

## 2019-10-02 MED FILL — Verapamil HCl IV Soln 2.5 MG/ML: INTRAVENOUS | Qty: 2 | Status: AC

## 2019-10-02 MED FILL — Heparin Sodium (Porcine) Inj 1000 Unit/ML: INTRAMUSCULAR | Qty: 10 | Status: AC

## 2019-10-02 NOTE — Patient Instructions (Addendum)
Medication Instructions:  Start Diltiazem (Cardizem) 120 mg daily   *If you need a refill on your cardiac medications before your next appointment, please call your pharmacy*   Lab Work: None ordered   If you have labs (blood work) drawn today and your tests are completely normal, you will receive your results only by: Marland Kitchen MyChart Message (if you have MyChart) OR . A paper copy in the mail If you have any lab test that is abnormal or we need to change your treatment, we will call you to review the results.   Testing/Procedures: Your physician has ordered for you to have a cardiac MRI  Your physician has referred you to see Pulmonology, someone will contact you with an appointment    Your physician has referred you to see Rheumatology, someone will contact you with an appointment      Follow-Up:  Your next appointment:   1 month(s)  The format for your next appointment:   In Person  Provider:   Thomasene Ripple, DO    Other Instructions None

## 2019-10-02 NOTE — Progress Notes (Signed)
Cardiology Office Note:    Date:  10/02/2019   ID:  Donna Sutton, DOB 09-13-1975, MRN [id number]  PCP:  Galvin Proffer, MD  Cardiologist:  Thomasene Ripple, DO  Electrophysiologist:  None   Referring MD: Galvin Proffer, MD   " I am still short of breath and I feel like I am going to pass out atleast once a day.   History of Present Illness:    Donna Sutton is a 44 y.o. female with a hx of hypertension,membranous VSD, hyperlipidemia presents today for follow-up visit.  I last saw the patient on 09/15/2019 at that time she was experiencing significant shortness of breath. In the setting of hr VSD, I referred the patient for a TEE and RHC/LHC. She also complained of palpitations- at that time her monitor was still pending.   She was able to get the TEE in the right and left heart catheterization done yesterday.  Patient tells me the shortness of breath continues to get worse.  She also has episodes where she feels that she is experiencing presyncope episodes.  Past Medical History:  Diagnosis Date  . Abnormal laboratory test 01/17/2017   Formatting of this note might be different from the original. Abnormal ANA speckled pattern  . Atrophy of left kidney 10/15/2017  . B12 deficiency anemia   . Back pain 06/19/2014   10/1 IMO update  . Chest pain of uncertain etiology 06/20/2019  . Chest pressure 01/03/2017  . Chronic migraine 05/23/2017  . Essential hypertension   . Generalized anxiety disorder   . Low back pain   . Lupus anticoagulant disorder (HCC)   . Migraine with aura, intractable, without status migrainosus   . Mixed hyperlipidemia   . Numbness and tingling of left side of face 01/03/2017  . Other specified hypothyroidism   . Shortness of breath 01/03/2017  . Vitamin D deficiency     Past Surgical History:  Procedure Laterality Date  . BUBBLE STUDY  10/01/2019   Procedure: BUBBLE STUDY;  Surgeon: Parke Poisson, MD;  Location: [facility] ENDOSCOPY;  Service: Cardiovascular;;  .  CESAREAN SECTION     X4  . OVARIAN CYST REMOVAL    . RIGHT/LEFT HEART CATH AND CORONARY ANGIOGRAPHY N/A 10/01/2019   Procedure: RIGHT/LEFT HEART CATH AND CORONARY ANGIOGRAPHY;  Surgeon: Tonny Bollman, MD;  Location: [facility] INVASIVE CV LAB;  Service: Cardiovascular;  Laterality: N/A;  . TEE WITHOUT CARDIOVERSION N/A 10/01/2019   Procedure: TRANSESOPHAGEAL ECHOCARDIOGRAM (TEE);  Surgeon: Parke Poisson, MD;  Location: [facility] ENDOSCOPY;  Service: Cardiovascular;  Laterality: N/A;  . TUBAL LIGATION      Current Medications: Current Meds  Medication Sig  . ALPRAZolam (XANAX) 1 MG tablet Take 0.5-1 mg by mouth 2 (two) times daily as needed for anxiety.   . cholecalciferol (VITAMIN D3) 25 MCG (1000 UNIT) tablet Take 1,000 Units by mouth daily.  . cyanocobalamin 1000 MCG tablet Take 1,000 mcg by mouth daily.   . furosemide (LASIX) 20 MG tablet Take 1 tablet (20 mg total) by mouth daily.  . hydroxychloroquine (PLAQUENIL) 200 MG tablet Take 200 mg by mouth See admin instructions. Take 200 mg daily, may take a second 200 mg dose as needed for joint/body pain  . losartan (COZAAR) 25 MG tablet Take 1 tablet (25 mg total) by mouth daily.  . Omega-3 Fatty Acids (FISH OIL PO) Take 1 capsule by mouth 3 (three) times a week.  . phentermine (ADIPEX-P) 37.5 MG tablet Take 18.75 mg by mouth 2 (two)  times daily as needed (energy).   . potassium chloride SA (KLOR-CON) 20 MEQ tablet Take 1 tablet (20 mEq total) by mouth daily.  . propranolol (INDERAL) 20 MG tablet Take 1 tablet (20 mg total) by mouth 3 (three) times daily.  Marland Kitchen topiramate (TOPAMAX) 50 MG tablet Take 50 mg by mouth daily.  Marland Kitchen vortioxetine HBr (TRINTELLIX) 10 MG TABS tablet Take 20 mg by mouth daily.     Allergies:   Morphine, Other, Piperacillin-tazobactam in dex, and Contrast media [iodinated diagnostic agents]   Social History   Socioeconomic History  . Marital status: Unknown    Spouse name: Not on file  . Number of children: Not on file  .  Years of education: Not on file  . Highest education level: Not on file  Occupational History  . Not on file  Tobacco Use  . Smoking status: Never Smoker  . Smokeless tobacco: Never Used  Substance and Sexual Activity  . Alcohol use: Never  . Drug use: Never  . Sexual activity: Not on file  Other Topics Concern  . Not on file  Social History Narrative  . Not on file   Social Determinants of Health   Financial Resource Strain:   . Difficulty of Paying Living Expenses:   Food Insecurity:   . Worried About Programme researcher, broadcasting/film/video in the Last Year:   . Barista in the Last Year:   Transportation Needs:   . Freight forwarder (Medical):   Marland Kitchen Lack of Transportation (Non-Medical):   Physical Activity:   . Days of Exercise per Week:   . Minutes of Exercise per Session:   Stress:   . Feeling of Stress :   Social Connections:   . Frequency of Communication with Friends and Family:   . Frequency of Social Gatherings with Friends and Family:   . Attends Religious Services:   . Active Member of Clubs or Organizations:   . Attends Banker Meetings:   Marland Kitchen Marital Status:      Family History: The patient's family history includes Anxiety disorder in her mother; COPD in her mother; Kidney failure in her father.  ROS:   Review of Systems  Constitution: Negative for decreased appetite, fever and weight gain.  HENT: Negative for congestion, ear discharge, hoarse voice and sore throat.   Eyes: Negative for discharge, redness, vision loss in right eye and visual halos.  Cardiovascular: Negative for chest pain, dyspnea on exertion, leg swelling, orthopnea and palpitations.  Respiratory: Negative for cough, hemoptysis, shortness of breath and snoring.   Endocrine: Negative for heat intolerance and polyphagia.  Hematologic/Lymphatic: Negative for bleeding problem. Does not bruise/bleed easily.  Skin: Negative for flushing, nail changes, rash and suspicious lesions.    Musculoskeletal: Negative for arthritis, joint pain, muscle cramps, myalgias, neck pain and stiffness.  Gastrointestinal: Negative for abdominal pain, bowel incontinence, diarrhea and excessive appetite.  Genitourinary: Negative for decreased libido, genital sores and incomplete emptying.  Neurological: Negative for brief paralysis, focal weakness, headaches and loss of balance.  Psychiatric/Behavioral: Negative for altered mental status, depression and suicidal ideas.  Allergic/Immunologic: Negative for HIV exposure and persistent infections.    EKGs/Labs/Other Studies Reviewed:    The following studies were reviewed today:   EKG: None today  Right and left heart cath 10/01/19 1.  Widely patent coronary arteries with no angiographic CAD 2.  Normal right heart hemodynamics with normal right and left heart filling pressures 3.  No evidence of oxygen  saturation step up  Findings do not suggest the presence of any significant source of intracardiac left to right shunting.  Pulmonary artery pressure, cardiac output, and intracardiac hemodynamics are essentially normal.    TEE IMPRESSIONS 10/01/2019 1. No definite membranous VSD. There is an area of apparent subvalvular ventricular septal thinning, possible mild septal aneursym. Color flow in clip 18 and 58 might suggest left to right shunt, however timing,  continous wave Doppler, and color flow jet are not consistent with the characteristic features of a small VSD.  Findings may instead represent swirling flow in an area of thin ventricular septum with mild aneurysmal quality.  2. Left ventricular ejection fraction, by estimation, is 60 to 65%. The left ventricle has normal function. The left ventricle has no regional wall motion abnormalities.  3. Right ventricular systolic function is normal. The right ventricular size is normal.  4. No left atrial/left atrial appendage thrombus was detected.  5. The mitral valve is normal in  structure. Trivial mitral valve regurgitation.  6. The aortic valve is tricuspid. Aortic valve regurgitation is not visualized.   Recent Labs: 08/20/2019: Magnesium 1.9 09/26/2019: BUN 11; Creatinine, Ser 1.22; Platelets 391 10/01/2019: Hemoglobin 12.2; Potassium 3.8; Sodium 141  Recent Lipid Panel No results found for: CHOL, TRIG, HDL, CHOLHDL, VLDL, LDLCALC, LDLDIRECT  Physical Exam:    VS:  BP (!) 152/100   Pulse 88   Ht  (1.6 m)   Wt 154 lb (69.9 kg)   SpO2 99%   BMI 27.28 kg/m     Wt Readings from Last 3 Encounters:  10/02/19 154 lb (69.9 kg)  10/01/19 153 lb (69.4 kg)  09/15/19 153 lb 12.8 oz (69.8 kg)     GEN: Well nourished, well developed in no acute distress HEENT: Normal NECK: No JVD; No carotid bruits LYMPHATICS: No lymphadenopathy CARDIAC: S1S2 noted,RRR, no murmurs, rubs, gallops RESPIRATORY:  Clear to auscultation without rales, wheezing or rhonchi  ABDOMEN: Soft, non-tender, non-distended, +bowel sounds, no guarding. EXTREMITIES: No edema, No cyanosis, no clubbing MUSCULOSKELETAL:  No deformity  SKIN: Warm and dry NEUROLOGIC:  Alert and oriented x 3, non-focal PSYCHIATRIC:  Normal affect, good insight  ASSESSMENT:    1. SOB (shortness of breath)   2. Aneurysm (HCC)   3. Primary ciliary dyskinesia   4. PAT (paroxysmal atrial tachycardia) (HCC)   5. Essential hypertension    PLAN:    She did have her TEE as well as however her catheterization yesterday.  Cardiac catheterization did not show any evidence of left to right shunting. The with no evidence of VSD but there is concern for aneurysm.  At this time is altered from cardiac MRI understands the patient's cardiac anatomy.  I was able to review her ZIO monitor showed evidence of paroxysmal atrial tachycardia with her HR up to 214 bpm, I will start her on Cardizem 120 mg daily.    Her blood pressure is elevated in the office start of Cardizem 120 mg also help with her hypertension.  I think is  more likely is explained she also needs to see a pulmonary doctor to get pulmonary function test and with her history of primary ciliary dyskinesia and I recommend she see a rheumatologist.   The patient tells me that because of her persistent symptoms she is not able to perform her duties at work therefore recommend keep her out of work and will she get her MRI and also follow-up hopefully the medication changes will help.  The patient  is in agreement with the above plan. The patient left the office in stable condition.  The patient will follow up in 1 month or sooner if needed.   Medication Adjustments/Labs and Tests Ordered: Current medicines are reviewed at length with the patient today.  Concerns regarding medicines are outlined above.  Orders Placed This Encounter  Procedures  . MR Card Morphology Wo/W Cm  . Ambulatory referral to Pulmonology  . Ambulatory referral to Rheumatology   Meds ordered this encounter  Medications  . diltiazem (CARDIZEM CD) 120 MG 24 hr capsule    Sig: Take 1 capsule (120 mg total) by mouth daily.    Dispense:  90 capsule    Refill:  3    Patient Instructions  Medication Instructions:  Start Diltiazem (Cardizem) 120 mg daily   *If you need a refill on your cardiac medications before your next appointment, please call your pharmacy*   Lab Work: None ordered   If you have labs (blood work) drawn today and your tests are completely normal, you will receive your results only by: Marland Kitchen MyChart Message (if you have MyChart) OR . A paper copy in the mail If you have any lab test that is abnormal or we need to change your treatment, we will call you to review the results.   Testing/Procedures: Your physician has ordered for you to have a cardiac MRI  Your physician has referred you to see Pulmonology, someone will contact you with an appointment    Your physician has referred you to see Rheumatology, someone will contact you with an appointment        Follow-Up:  Your next appointment:   1 month(s)  The format for your next appointment:   In Person  Provider:   Thomasene Ripple, DO    Other Instructions None      Adopting a Healthy Lifestyle.  Know what a healthy weight is for you (roughly BMI <25) and aim to maintain this   Aim for 7+ servings of fruits and vegetables daily   65-80+ fluid ounces of water or unsweet tea for healthy kidneys   Limit to max 1 drink of alcohol per day; avoid smoking/tobacco   Limit animal fats in diet for cholesterol and heart health - choose grass fed whenever available   Avoid highly processed foods, and foods high in saturated/trans fats   Aim for low stress - take time to unwind and care for your mental health   Aim for 150 min of moderate intensity exercise weekly for heart health, and weights twice weekly for bone health   Aim for 7-9 hours of sleep daily   When it comes to diets, agreement about the perfect plan isnt easy to find, even among the experts. Experts at the [facility] [employer] developed an idea known as the Healthy Eating Plate. Just imagine a plate divided into logical, healthy portions.   The emphasis is on diet quality:   Load up on vegetables and fruits - one-half of your plate: Aim for color and variety, and remember that potatoes dont count.   Go for whole grains - one-quarter of your plate: Whole wheat, barley, wheat berries, quinoa, oats, brown rice, and foods made with them. If you want pasta, go with whole wheat pasta.   Protein power - one-quarter of your plate: Fish, chicken, beans, and nuts are all healthy, versatile protein sources. Limit red meat.   The diet, however, does go beyond the plate, offering a few  other suggestions.   Use healthy plant oils, such as olive, canola, soy, corn, sunflower and peanut. Check the labels, and avoid partially hydrogenated oil, which have unhealthy trans fats.   If youre thirsty, drink water. Coffee and  tea are good in moderation, but skip sugary drinks and limit milk and dairy products to one or two daily servings.   The type of carbohydrate in the diet is more important than the amount. Some sources of carbohydrates, such as vegetables, fruits, whole grains, and beans-are healthier than others.   Finally, stay active  Signed, Thomasene RippleKardie Shirleyann Montero, DO  10/02/2019 5:06 PM    [facility]

## 2019-10-15 ENCOUNTER — Ambulatory Visit: Payer: PRIVATE HEALTH INSURANCE | Admitting: Cardiology

## 2019-10-23 ENCOUNTER — Telehealth (HOSPITAL_COMMUNITY): Payer: Self-pay | Admitting: *Deleted

## 2019-10-23 NOTE — Telephone Encounter (Signed)
Reaching out to patient to offer assistance regarding upcoming cardiac imaging study; pt verbalizes understanding of appt date/time, parking situation and where to check in, and verified current allergies; name and call back number provided for further questions should they arise  [facility] [phone] office [phone] cell

## 2019-10-24 ENCOUNTER — Ambulatory Visit (HOSPITAL_COMMUNITY)
Admission: RE | Admit: 2019-10-24 | Discharge: 2019-10-24 | Disposition: A | Discharge status: Home / Self Care | Payer: 59 | Source: Ambulatory Visit | Attending: Cardiology | Admitting: Cardiology

## 2019-10-24 DIAGNOSIS — I729 Aneurysm of unspecified site: Secondary | ICD-10-CM | POA: Insufficient documentation

## 2019-10-24 MED ORDER — GADOBUTROL 1 MMOL/ML IV SOLN
8.0000 mL | Freq: Once | INTRAVENOUS | Status: AC | PRN
  Administered 2019-10-24: 8 mL via INTRAVENOUS

## 2019-10-28 ENCOUNTER — Telehealth: Payer: Self-pay | Admitting: Cardiology

## 2019-10-28 ENCOUNTER — Telehealth: Payer: Self-pay

## 2019-10-28 NOTE — Telephone Encounter (Signed)
     I went in pt's chart to see who called her. I  transferred call to Delorse Limber, she talked to the patient

## 2019-10-28 NOTE — Telephone Encounter (Signed)
-----   Message from Thomasene Ripple, DO sent at 10/28/2019  2:38 PM EDT ----- Cardiac MRI is normal no evidence of a VSD.

## 2019-10-28 NOTE — Telephone Encounter (Signed)
Tried calling patient. No answer and voicemail is full so I can not leave a message at this time.  

## 2019-10-28 NOTE — Telephone Encounter (Signed)
Spoke with patient regarding results and recommendation.  Patient verbalizes understanding and is agreeable to plan of care. Advised patient to call back with any issues or concerns.  

## 2019-10-28 NOTE — Telephone Encounter (Signed)
-----   Message from Kardie Tobb, DO sent at 10/28/2019  2:38 PM EDT ----- °Cardiac MRI is normal no evidence of a VSD. °

## 2019-10-29 ENCOUNTER — Institutional Professional Consult (permissible substitution): Payer: Self-pay | Admitting: Pulmonary Disease

## 2019-11-10 DIAGNOSIS — I471 Supraventricular tachycardia: Secondary | ICD-10-CM

## 2019-11-10 MED ORDER — DILTIAZEM HCL ER COATED BEADS 180 MG PO CP24: 180.0000 mg | ORAL_CAPSULE | Freq: Every day | ORAL | 1 refills | Status: DC

## 2019-11-10 NOTE — Addendum Note (Signed)
Addended by: Lita Mains on: 11/10/2019 04:38 PM   Modules accepted: Orders

## 2019-11-11 ENCOUNTER — Ambulatory Visit: Payer: Self-pay | Admitting: Cardiology

## 2019-11-17 ENCOUNTER — Institutional Professional Consult (permissible substitution): Payer: Self-pay | Admitting: Critical Care Medicine

## 2019-11-19 ENCOUNTER — Ambulatory Visit: Payer: Self-pay | Admitting: Cardiology

## 2019-11-24 ENCOUNTER — Institutional Professional Consult (permissible substitution): Payer: Self-pay | Admitting: Pulmonary Disease

## 2019-11-27 ENCOUNTER — Institutional Professional Consult (permissible substitution): Payer: 59 | Admitting: Cardiology

## 2019-12-01 ENCOUNTER — Ambulatory Visit: Payer: Self-pay | Admitting: Cardiology

## 2019-12-04 ENCOUNTER — Other Ambulatory Visit: Payer: Self-pay

## 2019-12-04 ENCOUNTER — Ambulatory Visit: Payer: 59 | Admitting: Cardiology

## 2019-12-04 ENCOUNTER — Encounter: Payer: Self-pay | Admitting: Cardiology

## 2019-12-04 VITALS — BP 182/122 | HR 78 | Ht 63.0 in | Wt 155.0 lb

## 2019-12-04 DIAGNOSIS — I471 Supraventricular tachycardia: Secondary | ICD-10-CM | POA: Diagnosis not present

## 2019-12-04 DIAGNOSIS — R002 Palpitations: Secondary | ICD-10-CM

## 2019-12-04 DIAGNOSIS — I4719 Other supraventricular tachycardia: Secondary | ICD-10-CM

## 2019-12-04 MED ORDER — LOSARTAN POTASSIUM 50 MG PO TABS
50.0000 mg | ORAL_TABLET | Freq: Every day | ORAL | 3 refills | Status: DC
Start: 2019-12-04 — End: 2020-07-19

## 2019-12-04 MED ORDER — DILTIAZEM HCL ER COATED BEADS 240 MG PO CP24
240.0000 mg | ORAL_CAPSULE | Freq: Every day | ORAL | 3 refills | Status: AC
Start: 2019-12-04 — End: ?

## 2019-12-04 NOTE — Patient Instructions (Addendum)
Medication Instructions:  Your physician has recommended you make the following change in your medication:  1. STOP Propranolol 2. INCREASE Diltiazem to 240 mg once daily 3. INCREASE Losartan 50 mg once daily  *If you need a refill on your cardiac medications before your next appointment, please call your pharmacy*   Lab Work: None ordered   Testing/Procedures: None ordered   Follow-Up: At [facility], you and your health needs are our priority.  As part of our continuing mission to provide you with exceptional heart care, we have created designated Provider Care Teams.  These Care Teams include your primary Cardiologist (physician) and Advanced Practice Providers (APPs -  Physician Assistants and Nurse Practitioners) who all work together to provide you with the care you need, when you need it.  We recommend signing up for the patient portal called "MyChart".  Sign up information is provided on this After Visit Summary.  MyChart is used to connect with patients for Virtual Visits (Telemedicine).  Patients are able to view lab/test results, encounter notes, upcoming appointments, etc.  Non-urgent messages can be sent to your provider as well.   To learn more about what you can do with MyChart, go to [email or web address].    Your next appointment:    As needed  The format for your next appointment:   In Person  Provider:   Loman Brooklyn, MD    Keep your follow up, in October, with Dr. Servando Salina.   Thank you for choosing [facility]!!   Dory Horn, RN [phone]    Other Instructions

## 2019-12-04 NOTE — Progress Notes (Signed)
Electrophysiology Office Note   Date:  12/04/2019   ID:  Donna Sutton, DOB Jul 25, 1975, MRN [id number]  PCP:  Galvin Proffer, MD  Cardiologist:  [facility] Primary Electrophysiologist:  Masen Luallen Jorja Loa, MD    Chief Complaint: palpitations   History of Present Illness: Donna Sutton is a 44 y.o. female who is being seen today for the evaluation of palpitations at the request of Hague, Myrene Galas, MD. Presenting today for electrophysiology evaluation.  She has a history of hypertension, membranous VSD, hyperlipidemia.  She has complaints of shortness of breath and presyncope.  She wore a cardiac monitor that showed atrial tachycardia with heart rates up to 214 bpm and was started on diltiazem.  Today, she denies symptoms of orthopnea, PND, lower extremity edema, claudication, dizziness, presyncope, syncope, bleeding, or neurologic sequela. The patient is tolerating medications without difficulties. Her main complaints are of chest pain, shortness of breath, and palpitations. She feels like she Donna Sutton which is happened multiple times. This is not dependent on positioning. Prior to feeling like she is going to pass Sutton, she has numbness and tingling down her left arm and into her chest. She has had a left heart catheterization that showed no evidence of coronary artery disease. She says that this all started in March. She apparently passed Sutton at a restaurant. She is almost passed Sutton multiple times at her workplace. Prior to March, she felt well without major complaint.   Past Medical History:  Diagnosis Date  . Abnormal laboratory test 01/17/2017   Formatting of this note might be different from the original. Abnormal ANA speckled pattern  . Atrophy of left kidney 10/15/2017  . B12 deficiency anemia   . Back pain 06/19/2014   10/1 IMO update  . Chest pain of uncertain etiology 06/20/2019  . Chest pressure 01/03/2017  . Chronic migraine 05/23/2017  . Essential hypertension   .  Generalized anxiety disorder   . Low back pain   . Lupus anticoagulant disorder (HCC)   . Migraine with aura, intractable, without status migrainosus   . Mixed hyperlipidemia   . Numbness and tingling of left side of face 01/03/2017  . Other specified hypothyroidism   . Shortness of breath 01/03/2017  . Vitamin D deficiency    Past Surgical History:  Procedure Laterality Date  . BUBBLE STUDY  10/01/2019   Procedure: BUBBLE STUDY;  Surgeon: Parke Poisson, MD;  Location: [facility] ENDOSCOPY;  Service: Cardiovascular;;  . CESAREAN SECTION     X4  . OVARIAN CYST REMOVAL    . RIGHT/LEFT HEART CATH AND CORONARY ANGIOGRAPHY N/A 10/01/2019   Procedure: RIGHT/LEFT HEART CATH AND CORONARY ANGIOGRAPHY;  Surgeon: Tonny Bollman, MD;  Location: [facility] INVASIVE CV LAB;  Service: Cardiovascular;  Laterality: N/A;  . TEE WITHOUT CARDIOVERSION N/A 10/01/2019   Procedure: TRANSESOPHAGEAL ECHOCARDIOGRAM (TEE);  Surgeon: Parke Poisson, MD;  Location: [facility] ENDOSCOPY;  Service: Cardiovascular;  Laterality: N/A;  . TUBAL LIGATION       Current Outpatient Medications  Medication Sig Dispense Refill  . ALPRAZolam (XANAX) 1 MG tablet Take 0.5-1 mg by mouth 2 (two) times daily as needed for anxiety.     . cholecalciferol (VITAMIN D3) 25 MCG (1000 UNIT) tablet Take 1,000 Units by mouth daily.    . cyanocobalamin 1000 MCG tablet Take 1,000 mcg by mouth daily.     . furosemide (LASIX) 20 MG tablet Take 1 tablet (20 mg total) by mouth daily. 90 tablet 3  . hydroxychloroquine (PLAQUENIL)  200 MG tablet Take 200 mg by mouth See admin instructions. Take 200 mg daily, may take a second 200 mg dose as needed for joint/body pain    . Omega-3 Fatty Acids (FISH OIL PO) Take 1 capsule by mouth 3 (three) times a week.    . phentermine (ADIPEX-P) 37.5 MG tablet Take 18.75 mg by mouth 2 (two) times daily as needed (energy).     . potassium chloride SA (KLOR-CON) 20 MEQ tablet Take 1 tablet (20 mEq total) by mouth daily. 90 tablet 3    . topiramate (TOPAMAX) 50 MG tablet Take 50 mg by mouth daily.    Marland Kitchen vortioxetine HBr (TRINTELLIX) 10 MG TABS tablet Take 20 mg by mouth daily.    Marland Kitchen diltiazem (CARDIZEM CD) 240 MG 24 hr capsule Take 1 capsule (240 mg total) by mouth daily. 30 capsule 3  . losartan (COZAAR) 50 MG tablet Take 1 tablet (50 mg total) by mouth daily. 30 tablet 3   No current facility-administered medications for this visit.   Facility-Administered Medications Ordered in Other Visits  Medication Dose Route Frequency Provider Last Rate Last Admin  . regadenoson (LEXISCAN) injection SOLN 0.4 mg  0.4 mg Intravenous Once Little Ishikawa, MD        Allergies:   Morphine, Other, Piperacillin-tazobactam in dex, and Contrast media [iodinated diagnostic agents]   Social History:  The patient  reports that she has never smoked. She has never used smokeless tobacco. She reports that she does not drink alcohol and does not use drugs.   Family History:  The patient's family history includes Anxiety disorder in her mother; COPD in her mother; Kidney failure in her father.    ROS:  Please see the history of present illness.   Otherwise, review of systems is positive for none.   All other systems are reviewed and negative.    PHYSICAL EXAM: VS:  BP (!) 182/122   Pulse 78   Ht 5\' 3"  (1.6 m)   Wt 155 lb (70.3 kg)   SpO2 99%   BMI 27.46 kg/m  , BMI Body mass index is 27.46 kg/m. GEN: Well nourished, well developed, in no acute distress  HEENT: normal  Neck: no JVD, carotid bruits, or masses Cardiac: RRR; no murmurs, rubs, or gallops,no edema  Respiratory:  clear to auscultation bilaterally, normal work of breathing GI: soft, nontender, nondistended, + BS MS: no deformity or atrophy  Skin: warm and dry Neuro:  Strength and sensation are intact Psych: euthymic mood, full affect  EKG:  EKG is ordered today. Personal review of the ekg ordered shows sinus rhythm   Recent Labs: 08/20/2019: Magnesium  1.9 09/26/2019: BUN 11; Creatinine, Ser 1.22; Platelets 391 10/01/2019: Hemoglobin 12.2; Potassium 3.8; Sodium 141    Lipid Panel  No results found for: CHOL, TRIG, HDL, CHOLHDL, VLDL, LDLCALC, LDLDIRECT   Wt Readings from Last 3 Encounters:  12/04/19 155 lb (70.3 kg)  10/02/19 154 lb (69.9 kg)  10/01/19 153 lb (69.4 kg)      Other studies Reviewed: Additional studies/ records that were reviewed today include: TEE 10/02/19  Review of the above records today demonstrates:  1. No definite membranous VSD. There is an area of apparent subvalvular  ventricular septal thinning, possible mild septal aneursym. Color flow in  clip 18 and 58 might suggest left to right shunt, however timing,  continous wave Doppler, and color flow  jet are not consistent with the characteristic features of a small VSD.  Findings  may instead represent swirling flow in an area of thin  ventricular septum with mild aneurysmal quality.  2. Left ventricular ejection fraction, by estimation, is 60 to 65%. The  left ventricle has normal function. The left ventricle has no regional  wall motion abnormalities.  3. Right ventricular systolic function is normal. The right ventricular  size is normal.  4. No left atrial/left atrial appendage thrombus was detected.  5. The mitral valve is normal in structure. Trivial mitral valve  regurgitation.  6. The aortic valve is tricuspid. Aortic valve regurgitation is not  visualized.   Right heart cath/left heart cath 10/01/2019 1.  Widely patent coronary arteries with no angiographic CAD 2.  Normal right heart hemodynamics with normal right and left heart filling pressures 3.  No evidence of oxygen saturation step up  Cardiac monitor 10/02/2019 personally reviewed The minimum heart rate was 61 bpm, maximum heart rate was 214 bpm, and average heart rate was 91 bpm. Predominant underlying rhythm was Sinus Rhythm. 1 run of Supraventricular Tachycardia occurred lasting 7.8  secs with a maximum rate of 214 bpm (average 175 bpm).  Premature atrial complexes were rare (<1.0%). Premature Ventricular complexes were rare (<1.0%). Ventricular Trigeminy was present. No ventricular tachycardia, no pauses, No AV block and no atrial fibrillation present. 132 patient trigger events were noted: 6 premature ventricular complexes were noted, 4 premature atrial complexes were noted and the remaining was associated with sinus rhythm.    ASSESSMENT AND PLAN:  1. Near syncope with palpitations: Unclear to me as the cause of her near syncope. She wore a cardiac monitor and had symptoms while wearing the monitor. Her symptoms were associated with sinus rhythm and sinus tachycardia. She is on both propranolol and diltiazem. She is having trouble taking her propranolol as it is a 3 times a day drug. We Donna Sutton stop propranolol today and increase her diltiazem and have her take it at night as she is potentially having some fatigue from this medication. I did review her cardiac monitor which showed sinus rhythm and sinus tachycardia. She had a short run of SVT, but only 8 seconds worth of arrhythmia. At this point, I do not feel that she has an arrhythmogenic cause of her symptoms.  2. Hypertension: Patient is on very low-dose losartan. Her blood pressure is quite elevated in clinic today. We Donna Sutton increase her losartan to 50 mg.  Case discussed with primary cardiology    Current medicines are reviewed at length with the patient today.   The patient does not have concerns regarding her medicines.  The following changes were made today: Stop propranolol, increase diltiazem, increase losartan  Labs/ tests ordered today include:  Orders Placed This Encounter  Procedures  . EKG 12-Lead     Disposition:   FU with Donna Sutton as needed  Signed, Donna Vore Jorja Loa, MD  12/04/2019 9:59 AM     [facility] [address] [place] [address] [phone]  (office) [phone] (fax)

## 2019-12-23 ENCOUNTER — Institutional Professional Consult (permissible substitution): Payer: 59 | Admitting: Pulmonary Disease

## 2020-01-06 ENCOUNTER — Ambulatory Visit: Payer: Self-pay | Admitting: Cardiology

## 2020-01-07 ENCOUNTER — Institutional Professional Consult (permissible substitution): Payer: 59 | Admitting: Pulmonary Disease

## 2020-07-19 ENCOUNTER — Other Ambulatory Visit: Payer: Self-pay

## 2020-07-19 MED ORDER — LOSARTAN POTASSIUM 50 MG PO TABS: 50.0000 mg | ORAL_TABLET | Freq: Every day | ORAL | 1 refills | Status: AC

## 2020-10-07 IMAGING — CR DG CHEST 2V
2 series · 2 of 2 positions shown · non-contrast
Comparison: 02/20/2016

CLINICAL DATA: Left-sided chest pain for several weeks

EXAM:
CHEST - 2 VIEW

[chest pa]
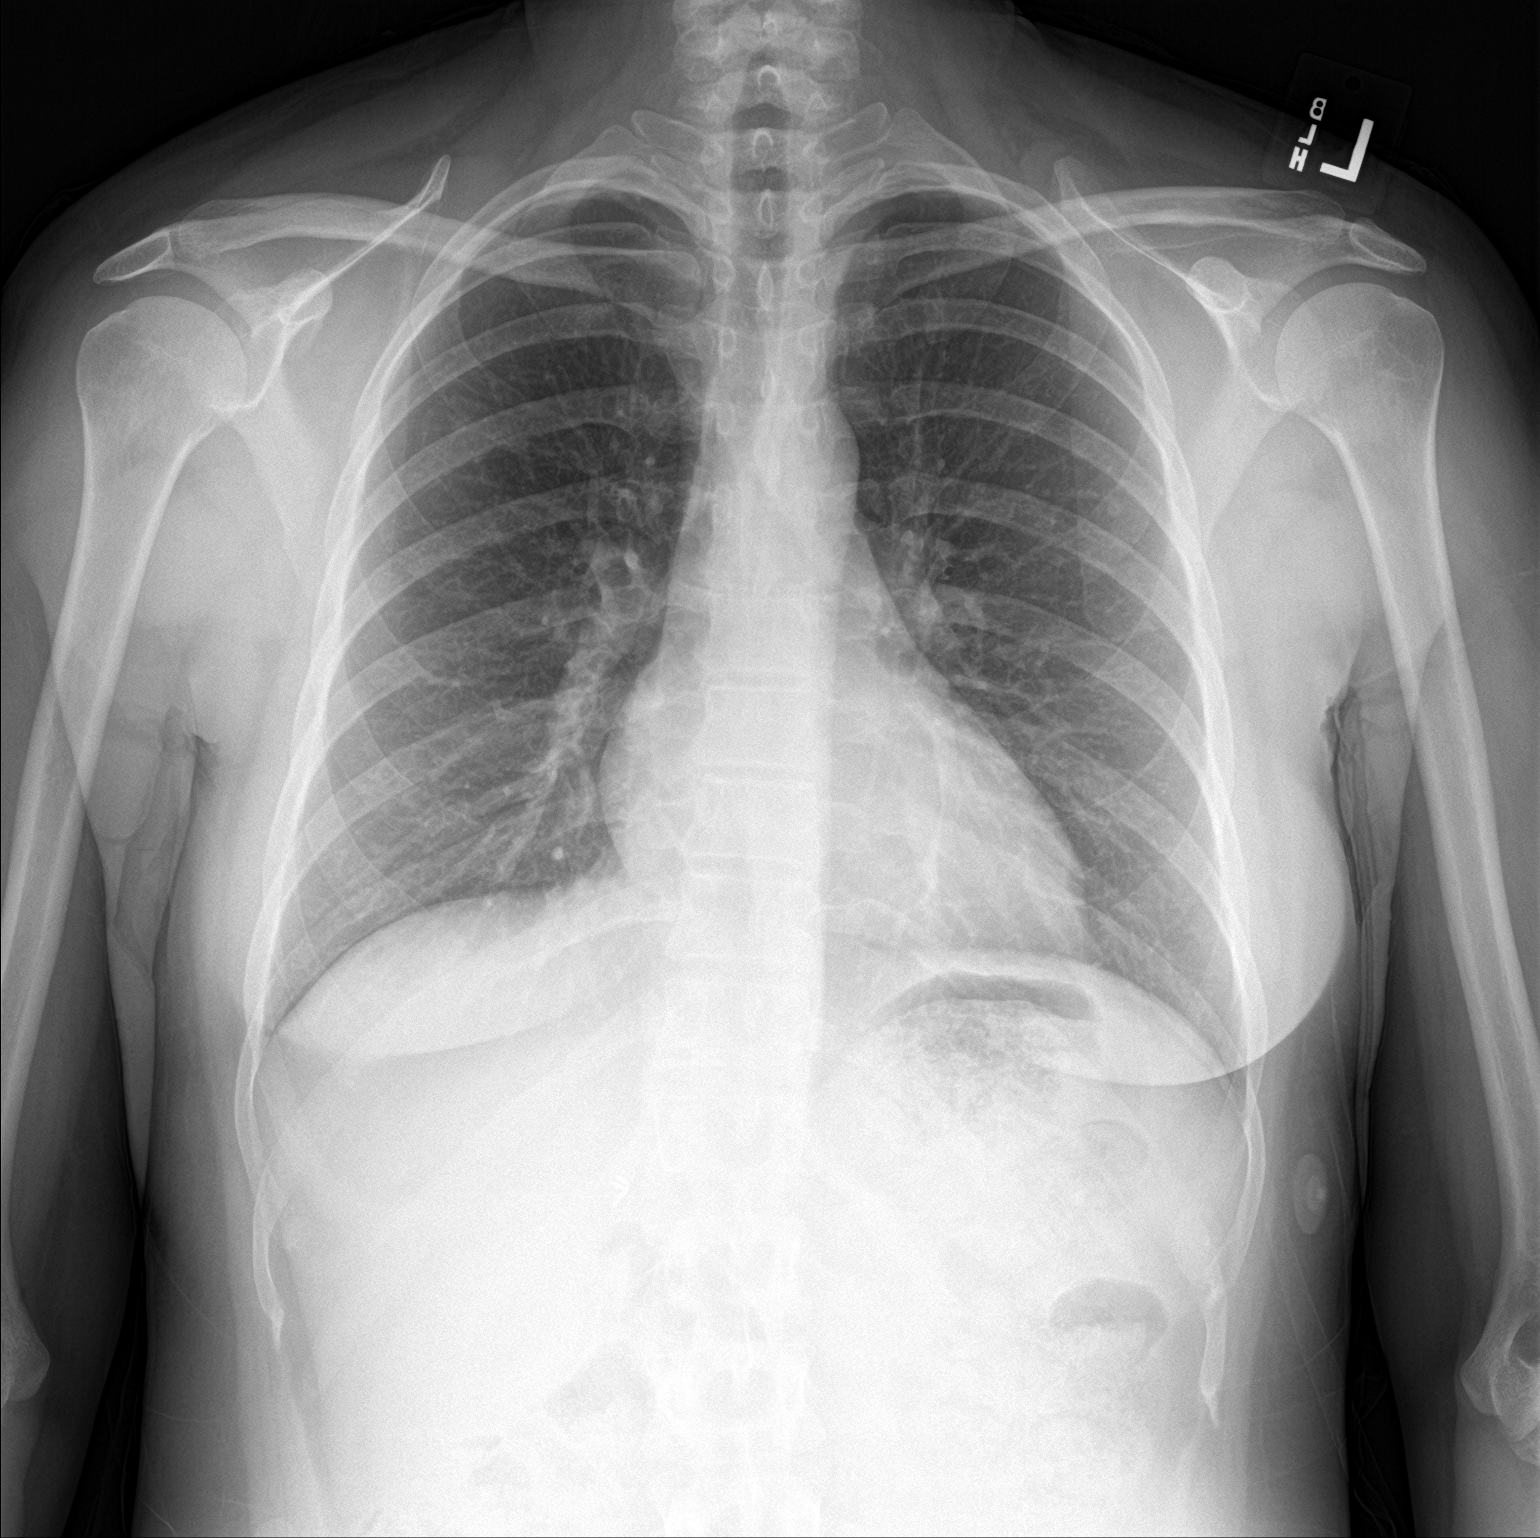

[chest lat]
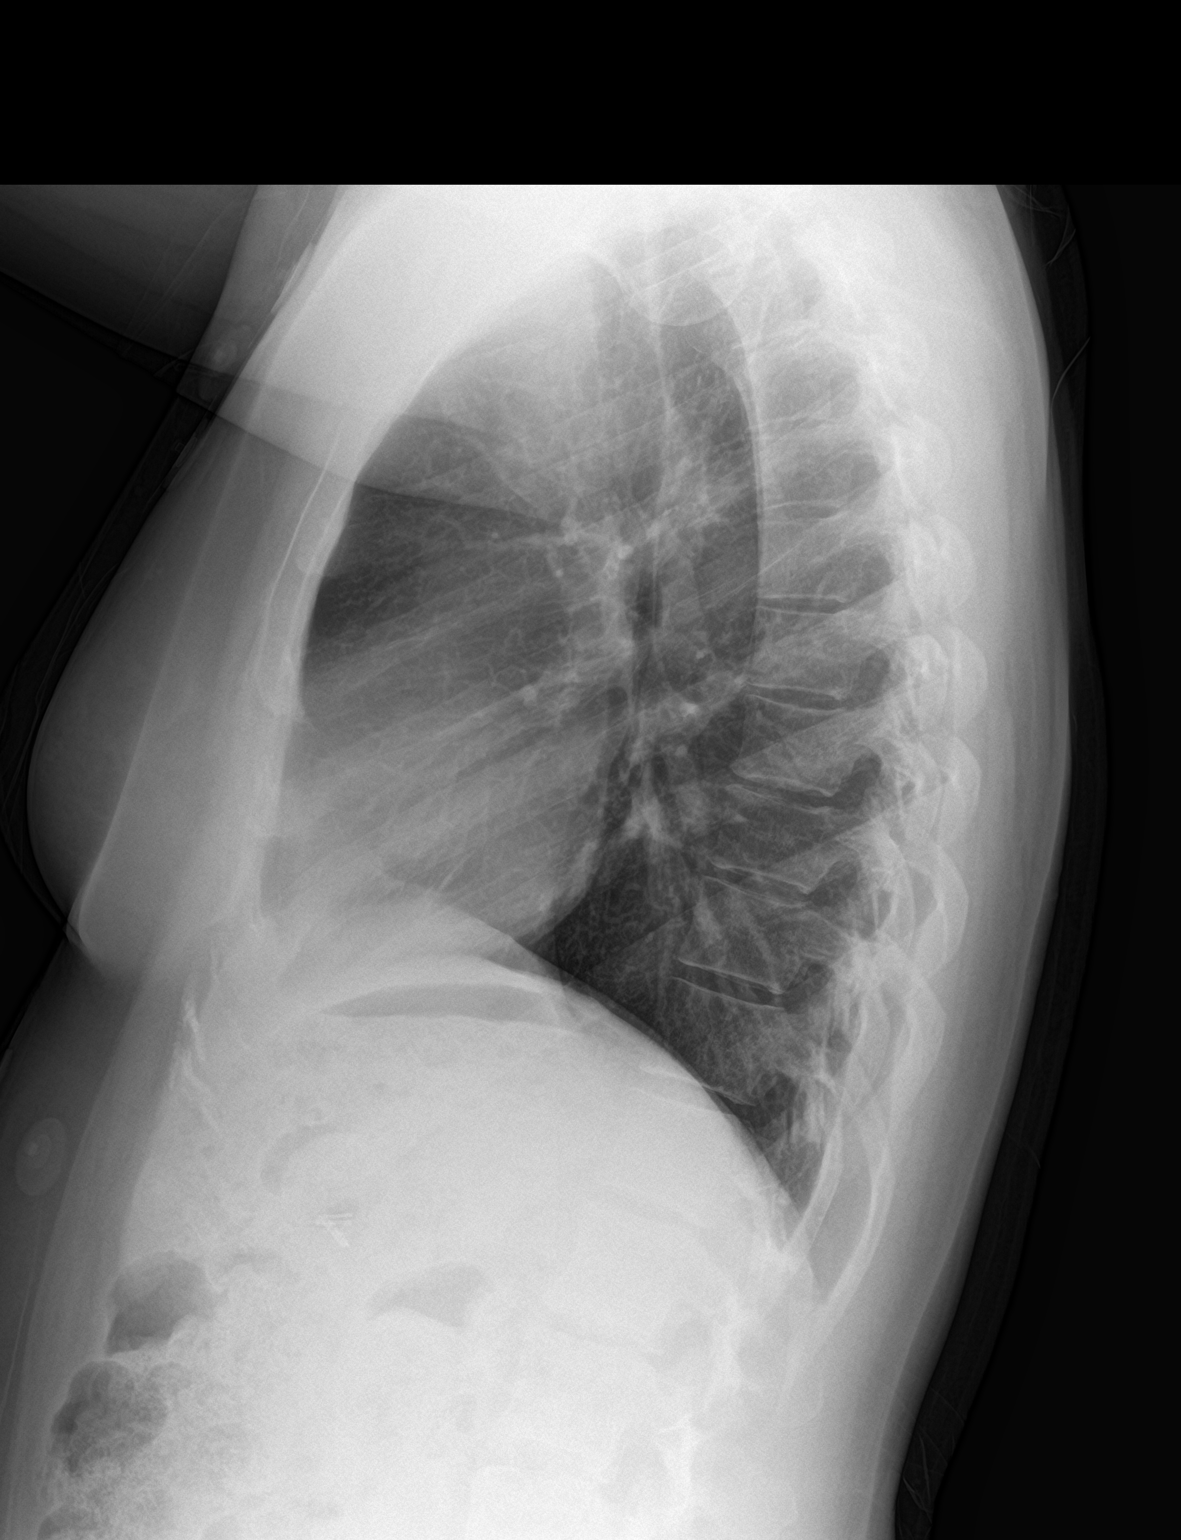

[2 of 2 positions shown; findings below may reference images not displayed]

FINDINGS: The heart size and mediastinal contours are within normal limits.
Both lungs are clear. The visualized skeletal structures are
unremarkable.
IMPRESSION: No active cardiopulmonary disease.

## 2020-11-16 IMAGING — MR MR CARD MORPHOLOGY WO/W CM
45 of 48 series · 45 of 48 positions shown · IV contrast (gadavist)
Comparison: none

EXAM:
CARDIAC MRI
TECHNIQUE: The patient was scanned on a 1.5 Tesla GE magnet. A dedicated
cardiac coil was used. Functional imaging was done using Fiesta
sequences. [DATE], and 4 chamber views were done to assess for RWMA's.
Modified Medidi rule using a short axis stack was used to
calculate an ejection fraction on a dedicated work station using
Circle software. The patient received 10 cc of Gadavist. After 10
minutes inversion recovery sequences were used to assess for
infiltration and scar tissue.

CONTRAST:  Gadavist 10 cc

[Series 4: [id number] · axial · 8.0mm · 1.33mm/px · 1 of 16 slices shown]
[im 1/16]
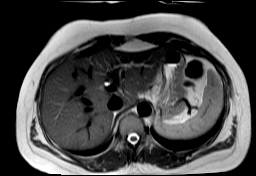

[Series 8: bSSFP · oblique · 8.0mm · 1.61mm/px · 1 of 25 slices shown (1 of 19)]
[im 1/25]
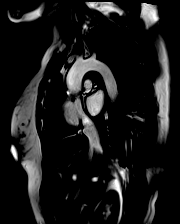

[Series 9: bSSFP · oblique · 8.0mm · 1.61mm/px · 1 of 25 slices shown (2 of 19)]
[im 1/25]
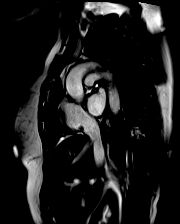

[Series 10: bSSFP · oblique · 8.0mm · 1.61mm/px · 1 of 25 slices shown (3 of 19)]
[im 1/25]
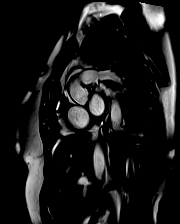

[Series 11: bSSFP · oblique · 8.0mm · 1.61mm/px · 1 of 25 slices shown (4 of 19)]
[im 1/25]
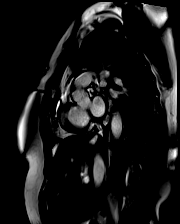

[Series 12: bSSFP · oblique · 8.0mm · 1.61mm/px · 1 of 25 slices shown (5 of 19)]
[im 1/25]
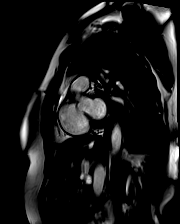

[Series 13: bSSFP · oblique · 8.0mm · 1.61mm/px · 1 of 25 slices shown (6 of 19)]
[im 1/25]
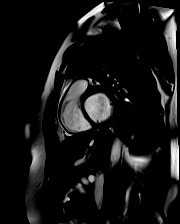

[Series 14: bSSFP · oblique · 8.0mm · 1.61mm/px · 1 of 25 slices shown (7 of 19)]
[im 1/25]
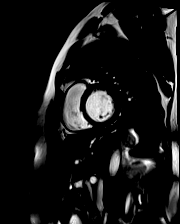

[Series 15: bSSFP · oblique · 8.0mm · 1.61mm/px · 1 of 25 slices shown (8 of 19)]
[im 1/25]
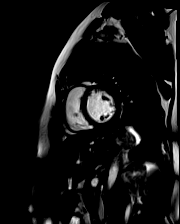

[Series 16: bSSFP · oblique · 8.0mm · 1.61mm/px · 1 of 25 slices shown (9 of 19)]
[im 1/25]
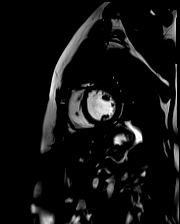

[Series 17: bSSFP · oblique · 8.0mm · 1.61mm/px · 1 of 25 slices shown (10 of 19)]
[im 1/25]
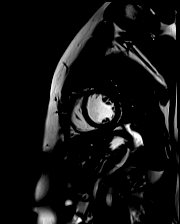

[Series 18: bSSFP · oblique · 8.0mm · 1.61mm/px · 1 of 25 slices shown (11 of 19)]
[im 1/25]
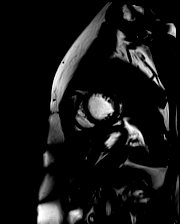

[Series 19: bSSFP · oblique · 8.0mm · 1.61mm/px · 1 of 25 slices shown (12 of 19)]
[im 1/25]
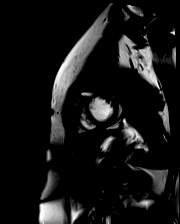

[Series 20: bSSFP · oblique · 8.0mm · 1.61mm/px · 1 of 25 slices shown (13 of 19)]
[im 1/25]
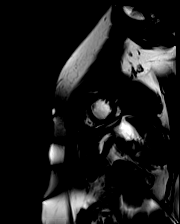

[Series 21: bSSFP · oblique · 8.0mm · 1.61mm/px · 1 of 25 slices shown (14 of 19)]
[im 1/25]
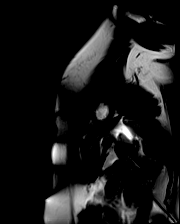

[Series 22: bSSFP · oblique · 8.0mm · 1.61mm/px · 1 of 25 slices shown (15 of 19)]
[im 1/25]
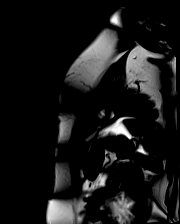

[Series 23: bSSFP · oblique · 8.0mm · 1.61mm/px · 1 of 25 slices shown (16 of 19)]
[im 1/25]
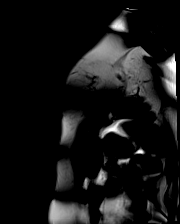

[Series 24: bSSFP · oblique · 8.0mm · 1.61mm/px · 1 of 25 slices shown (17 of 19)]
[im 1/25]
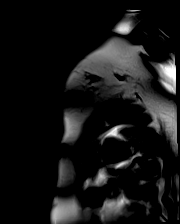

[Series 25: bSSFP · oblique · 8.0mm · 1.61mm/px · 1 of 25 slices shown (18 of 19)]
[im 1/25]
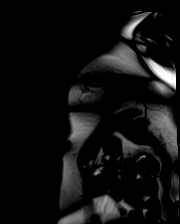

[Series 26: bSSFP · oblique · 8.0mm · 1.61mm/px · 1 of 25 slices shown (19 of 19)]
[im 1/25]
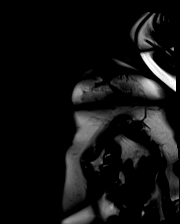

[Series 27: cine real time · axial · 8.0mm · 2.73mm/px · 1 of 20 slices shown (1 of 25)]
[im 1/20]
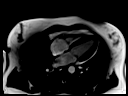

[Series 27: cine real time · axial · 8.0mm · 2.73mm/px · 1 of 20 slices shown (2 of 25)]
[im 1/20]
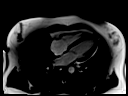

[Series 27: cine real time · axial · 8.0mm · 2.73mm/px · 1 of 20 slices shown (3 of 25)]
[im 1/20]
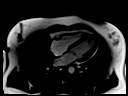

[Series 27: cine real time · axial · 8.0mm · 2.73mm/px · 1 of 20 slices shown (4 of 25)]
[im 1/20]
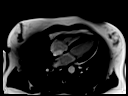

[Series 27: cine real time · axial · 8.0mm · 2.73mm/px · 1 of 20 slices shown (5 of 25)]
[im 1/20]
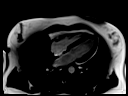

[Series 27: cine real time · axial · 8.0mm · 2.73mm/px · 1 of 20 slices shown (6 of 25)]
[im 1/20]
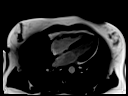

[Series 27: cine real time · axial · 8.0mm · 2.73mm/px · 1 of 20 slices shown (7 of 25)]
[im 1/20]
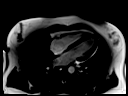

[Series 27: cine real time · axial · 8.0mm · 2.73mm/px · 1 of 20 slices shown (8 of 25)]
[im 1/20]
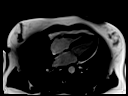

[Series 27: cine real time · axial · 8.0mm · 2.73mm/px · 1 of 20 slices shown (9 of 25)]
[im 1/20]
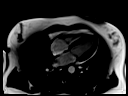

[Series 27: cine real time · axial · 8.0mm · 2.73mm/px · 1 of 20 slices shown (10 of 25)]
[im 1/20]
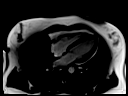

[Series 27: cine real time · axial · 8.0mm · 2.73mm/px · 1 of 20 slices shown (11 of 25)]
[im 1/20]
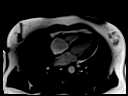

[Series 27: cine real time · axial · 8.0mm · 2.73mm/px · 1 of 20 slices shown (12 of 25)]
[im 1/20]
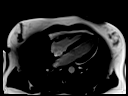

[Series 27: cine real time · axial · 8.0mm · 2.73mm/px · 1 of 20 slices shown (13 of 25)]
[im 1/20]
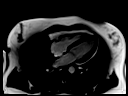

[Series 27: cine real time · axial · 8.0mm · 2.73mm/px · 1 of 20 slices shown (14 of 25)]
[im 1/20]
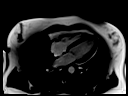

[Series 27: cine real time · axial · 8.0mm · 2.73mm/px · 1 of 20 slices shown (15 of 25)]
[im 1/20]
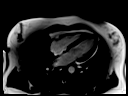

[Series 27: cine real time · axial · 8.0mm · 2.73mm/px · 1 of 20 slices shown (16 of 25)]
[im 1/20]
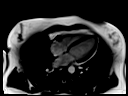

[Series 27: cine real time · axial · 8.0mm · 2.73mm/px · 1 of 20 slices shown (17 of 25)]
[im 1/20]
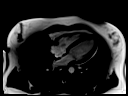

[Series 27: cine real time · axial · 8.0mm · 2.73mm/px · 1 of 20 slices shown (18 of 25)]
[im 1/20]
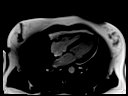

[Series 27: cine real time · axial · 8.0mm · 2.73mm/px · 1 of 20 slices shown (19 of 25)]
[im 1/20]
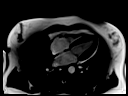

[Series 27: cine real time · axial · 8.0mm · 2.73mm/px · 1 of 20 slices shown (20 of 25)]
[im 1/20]
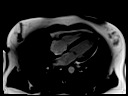

[Series 27: cine real time · axial · 8.0mm · 2.73mm/px · 1 of 20 slices shown (21 of 25)]
[im 1/20]
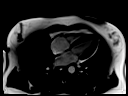

[Series 27: cine real time · axial · 8.0mm · 2.73mm/px · 1 of 20 slices shown (22 of 25)]
[im 1/20]
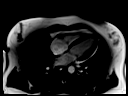

[Series 27: cine real time · axial · 8.0mm · 2.73mm/px · 1 of 20 slices shown (23 of 25)]
[im 1/20]
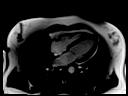

[Series 27: cine real time · axial · 8.0mm · 2.73mm/px · 1 of 20 slices shown (24 of 25)]
[im 1/20]
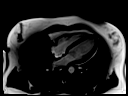

[Series 27: cine real time · axial · 8.0mm · 2.73mm/px · 1 of 20 slices shown (25 of 25)]
[im 1/20]
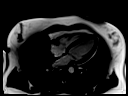

[45 of 48 positions shown; findings below may reference images not displayed]

FINDINGS: Limited images of the lung fields show no gross abnormalities.

Normal left ventricular size with normal wall thickness. No definite
abnormality seen in the area of the membranous ventricular septum.
Normal LV wall motion, EF 66%. Normal right ventricular size and
systolic function, EF 55%. Normal left and right atrial sizes.
Trileaflet aortic valve with no significant stenosis or
regurgitation. No significant mitral regurgitation noted.

On delayed enhancement imaging, there was no myocardial late
gadolinium enhancement (LGE).

Measurements:

LVEDV 121 mL

LVSV 80 mL

LVEF 66%

RVEDV 86 mL

RVSV 48 mL

RVEF 55%

Aortic valve forward volume 64 mL

Pulmonary valve forward volume 63 mL

Qp/Qs = 1
IMPRESSION: 1.  Normal LV size and systolic function, EF 66%.

2.  Normal RV size and systolic function, EF 55%.

3. No myocardial LGE, so no definitive evidence for prior MI,
infiltrative disease, or myocarditis.

4.  No evidence for significant VSD, Qp/Qs calculated as 1.

Rosa Isela Qazi
